# Patient Record
Sex: Male | Born: 1950 | Marital: Married | Smoking: Former smoker
Health system: Southern US, Community
[De-identification: ages and names within clinical notes are randomized; demographics above are authoritative.]

## PROBLEM LIST (undated history)

## (undated) DIAGNOSIS — C801 Malignant (primary) neoplasm, unspecified: Secondary | ICD-10-CM

## (undated) HISTORY — PX: OTHER SURGICAL HISTORY: SHX169

## (undated) HISTORY — PX: CERVICAL DISC SURGERY: SHX588

## (undated) HISTORY — PX: ROTATOR CUFF REPAIR: SHX139

---

## 2018-06-12 DIAGNOSIS — M1612 Unilateral primary osteoarthritis, left hip: Secondary | ICD-10-CM | POA: Diagnosis not present

## 2018-06-12 DIAGNOSIS — M25552 Pain in left hip: Secondary | ICD-10-CM | POA: Diagnosis not present

## 2018-06-27 ENCOUNTER — Other Ambulatory Visit: Payer: Self-pay | Admitting: Sports Medicine

## 2018-06-27 DIAGNOSIS — M1612 Unilateral primary osteoarthritis, left hip: Secondary | ICD-10-CM | POA: Diagnosis not present

## 2018-06-27 DIAGNOSIS — M7918 Myalgia, other site: Secondary | ICD-10-CM | POA: Diagnosis not present

## 2018-06-27 DIAGNOSIS — M25552 Pain in left hip: Secondary | ICD-10-CM

## 2018-07-02 ENCOUNTER — Ambulatory Visit (HOSPITAL_COMMUNITY)
Admission: RE | Admit: 2018-07-02 | Discharge: 2018-07-02 | Disposition: A | Discharge status: Home / Self Care | Payer: PPO | Source: Ambulatory Visit | Attending: Sports Medicine | Admitting: Sports Medicine

## 2018-07-02 ENCOUNTER — Other Ambulatory Visit: Payer: Self-pay

## 2018-07-02 DIAGNOSIS — M160 Bilateral primary osteoarthritis of hip: Secondary | ICD-10-CM | POA: Diagnosis not present

## 2018-07-02 DIAGNOSIS — M25552 Pain in left hip: Secondary | ICD-10-CM | POA: Diagnosis not present

## 2018-07-02 DIAGNOSIS — M7918 Myalgia, other site: Secondary | ICD-10-CM | POA: Insufficient documentation

## 2018-07-02 DIAGNOSIS — M1612 Unilateral primary osteoarthritis, left hip: Secondary | ICD-10-CM | POA: Insufficient documentation

## 2018-07-13 DIAGNOSIS — M1612 Unilateral primary osteoarthritis, left hip: Secondary | ICD-10-CM | POA: Diagnosis not present

## 2018-07-23 ENCOUNTER — Other Ambulatory Visit: Payer: Self-pay

## 2018-07-23 ENCOUNTER — Encounter
Admission: RE | Admit: 2018-07-23 | Discharge: 2018-07-23 | Disposition: A | Discharge status: Home / Self Care | Payer: PPO | Source: Ambulatory Visit | Attending: Orthopedic Surgery | Admitting: Orthopedic Surgery

## 2018-07-23 DIAGNOSIS — Z01818 Encounter for other preprocedural examination: Secondary | ICD-10-CM | POA: Insufficient documentation

## 2018-07-23 DIAGNOSIS — Z01810 Encounter for preprocedural cardiovascular examination: Secondary | ICD-10-CM | POA: Diagnosis not present

## 2018-07-23 DIAGNOSIS — Z1159 Encounter for screening for other viral diseases: Secondary | ICD-10-CM | POA: Insufficient documentation

## 2018-07-23 DIAGNOSIS — Z471 Aftercare following joint replacement surgery: Secondary | ICD-10-CM | POA: Diagnosis not present

## 2018-07-23 DIAGNOSIS — Z87891 Personal history of nicotine dependence: Secondary | ICD-10-CM | POA: Diagnosis not present

## 2018-07-23 DIAGNOSIS — Z96642 Presence of left artificial hip joint: Secondary | ICD-10-CM | POA: Diagnosis not present

## 2018-07-23 DIAGNOSIS — Z880 Allergy status to penicillin: Secondary | ICD-10-CM | POA: Diagnosis not present

## 2018-07-23 DIAGNOSIS — Z981 Arthrodesis status: Secondary | ICD-10-CM | POA: Diagnosis not present

## 2018-07-23 DIAGNOSIS — Z7951 Long term (current) use of inhaled steroids: Secondary | ICD-10-CM | POA: Diagnosis not present

## 2018-07-23 DIAGNOSIS — Z20828 Contact with and (suspected) exposure to other viral communicable diseases: Secondary | ICD-10-CM | POA: Diagnosis present

## 2018-07-23 DIAGNOSIS — M1612 Unilateral primary osteoarthritis, left hip: Secondary | ICD-10-CM | POA: Diagnosis present

## 2018-07-23 DIAGNOSIS — Z79899 Other long term (current) drug therapy: Secondary | ICD-10-CM | POA: Diagnosis not present

## 2018-07-23 DIAGNOSIS — Z8551 Personal history of malignant neoplasm of bladder: Secondary | ICD-10-CM | POA: Diagnosis not present

## 2018-07-23 HISTORY — DX: Malignant (primary) neoplasm, unspecified: C80.1

## 2018-07-23 LAB — URINALYSIS, ROUTINE W REFLEX MICROSCOPIC
Bilirubin Urine: NEGATIVE
Glucose, UA: NEGATIVE mg/dL
Hgb urine dipstick: NEGATIVE
Ketones, ur: NEGATIVE mg/dL
Leukocytes,Ua: NEGATIVE
Nitrite: NEGATIVE
Protein, ur: NEGATIVE mg/dL
Specific Gravity, Urine: 1.014 (ref 1.005–1.030)
pH: 6 (ref 5.0–8.0)

## 2018-07-23 LAB — TYPE AND SCREEN
ABO/RH(D): O POS
Antibody Screen: NEGATIVE

## 2018-07-23 LAB — CBC
HCT: 41.7 % (ref 39.0–52.0)
Hemoglobin: 14 g/dL (ref 13.0–17.0)
MCH: 31.6 pg (ref 26.0–34.0)
MCHC: 33.6 g/dL (ref 30.0–36.0)
MCV: 94.1 fL (ref 80.0–100.0)
Platelets: 210 10*3/uL (ref 150–400)
RBC: 4.43 MIL/uL (ref 4.22–5.81)
RDW: 13.3 % (ref 11.5–15.5)
WBC: 8.5 10*3/uL (ref 4.0–10.5)
nRBC: 0 % (ref 0.0–0.2)

## 2018-07-23 LAB — BASIC METABOLIC PANEL
Anion gap: 10 (ref 5–15)
BUN: 20 mg/dL (ref 8–23)
CO2: 24 mmol/L (ref 22–32)
Calcium: 9.1 mg/dL (ref 8.9–10.3)
Chloride: 100 mmol/L (ref 98–111)
Creatinine, Ser: 0.79 mg/dL (ref 0.61–1.24)
GFR calc Af Amer: >60 mL/min (ref >60)
GFR calc non Af Amer: >60 mL/min (ref >60)
Glucose, Bld: 90 mg/dL (ref 70–99)
Potassium: 4 mmol/L (ref 3.5–5.1)
Sodium: 134 mmol/L — ABNORMAL LOW (ref 135–145)

## 2018-07-23 LAB — SURGICAL PCR SCREEN
MRSA, PCR: NEGATIVE
Staphylococcus aureus: POSITIVE — AB

## 2018-07-23 LAB — PROTIME-INR
INR: 0.9 (ref 0.8–1.2)
Prothrombin Time: 12.4 seconds (ref 11.4–15.2)

## 2018-07-23 LAB — APTT: aPTT: 35 seconds (ref 24–36)

## 2018-07-23 LAB — SEDIMENTATION RATE: Sed Rate: 8 mm/hr (ref 0–20)

## 2018-07-23 NOTE — Patient Instructions (Signed)
Your procedure is scheduled on: Thursday 07/26/18 Report to [place]. To find out your arrival time please call [phone] between 1PM - 3PM on Wednesday 07/25/18.  Remember: Instructions that are not followed completely may result in serious medical risk, up to and including death, or upon the discretion of your surgeon and anesthesiologist your surgery may need to be rescheduled.     _X__ 1. Do not eat food after midnight the night before your procedure.                 No gum chewing or hard candies. You may drink clear liquids up to 2 hours                 before you are scheduled to arrive for your surgery- DO not drink clear                 liquids within 2 hours of the start of your surgery.                 Clear Liquids include:  water, apple juice without pulp, clear carbohydrate                 drink such as Clearfast or Gatorade, Black Coffee or Tea (Do not add                 anything to coffee or tea).  __X__2.  On the morning of surgery brush your teeth with toothpaste and water, you                 may rinse your mouth with mouthwash if you wish.  Do not swallow any              toothpaste of mouthwash.     _X__ 3.  No Alcohol for 24 hours before or after surgery.   _X__ 4.  Do Not Smoke or use e-cigarettes For 24 Hours Prior to Your Surgery.                 Do not use any chewable tobacco products for at least 6 hours prior to                 surgery.  ____  5.  Bring all medications with you on the day of surgery if instructed.   __X__  6.  Notify your doctor if there is any change in your medical condition      (cold, fever, infections).     Do not wear jewelry, make-up, hairpins, clips or nail polish. Do not wear lotions, powders, or perfumes.  Do not shave 48 hours prior to surgery. Men may shave face and neck. Do not bring valuables to the hospital.    [facility] is not responsible for any belongings or  valuables.  Contacts, dentures/partials or body piercings may not be worn into surgery. Bring a case for your contacts, glasses or hearing aids, a denture cup will be supplied. Leave your suitcase in the car. After surgery it may be brought to your room. For patients admitted to the hospital, discharge time is determined by your treatment team.   Patients discharged the day of surgery will not be allowed to drive home.   Please read over the following fact sheets that you were given:   MRSA Information  __X__ Take these medicines the morning of surgery with A SIP OF WATER:  1. loratadine (CLARITIN)  2.   3.   4.  5.  6.  ____ Fleet Enema (as directed)   __X__ Use CHG Soap/SAGE wipes as directed  ____ Use inhalers on the day of surgery  ____ Stop metformin/Janumet/Farxiga 2 days prior to surgery    ____ Take 1/2 of usual insulin dose the night before surgery. No insulin the morning          of surgery.   ____ Stop Blood Thinners Coumadin/Plavix/Xarelto/Pleta/Pradaxa/Eliquis/Effient/Aspirin  on   Or contact your Surgeon, Cardiologist or Medical Doctor regarding  ability to stop your blood thinners  __X__ Stop Anti-inflammatories 7 days before surgery such as Advil, Ibuprofen, Motrin,  BC or Goodies Powder, Naprosyn, Naproxen, Aleve, Aspirin    __X__ Stop all herbal supplements, fish oil or vitamin E until after surgery.    ____ Bring C-Pap to the hospital.

## 2018-07-24 LAB — NOVEL CORONAVIRUS, NAA (HOSP ORDER, SEND-OUT TO REF LAB; TAT 18-24 HRS): SARS-CoV-2, NAA: NOT DETECTED

## 2018-07-24 LAB — URINE CULTURE: Culture: NO GROWTH

## 2018-07-25 MED ORDER — CLINDAMYCIN PHOSPHATE 900 MG/50ML IV SOLN
900.0000 mg | Freq: Once | INTRAVENOUS | Status: AC
  Administered 2018-07-26: 900 mg via INTRAVENOUS

## 2018-07-26 ENCOUNTER — Inpatient Hospital Stay: Payer: PPO

## 2018-07-26 ENCOUNTER — Inpatient Hospital Stay: Payer: PPO | Admitting: Anesthesiology

## 2018-07-26 ENCOUNTER — Other Ambulatory Visit: Payer: Self-pay

## 2018-07-26 ENCOUNTER — Encounter
Admission: RE | Disposition: A | Discharge status: Home / Self Care | Payer: Self-pay | Source: Home / Self Care | Attending: Orthopedic Surgery

## 2018-07-26 ENCOUNTER — Inpatient Hospital Stay
Admission: RE | Admit: 2018-07-26 | Discharge: 2018-07-28 | DRG: 470 | Disposition: A | Discharge status: Home Health | Payer: PPO | Attending: Orthopedic Surgery | Admitting: Orthopedic Surgery

## 2018-07-26 DIAGNOSIS — Z7951 Long term (current) use of inhaled steroids: Secondary | ICD-10-CM | POA: Diagnosis not present

## 2018-07-26 DIAGNOSIS — Z8551 Personal history of malignant neoplasm of bladder: Secondary | ICD-10-CM | POA: Diagnosis not present

## 2018-07-26 DIAGNOSIS — Z880 Allergy status to penicillin: Secondary | ICD-10-CM

## 2018-07-26 DIAGNOSIS — Z79899 Other long term (current) drug therapy: Secondary | ICD-10-CM | POA: Diagnosis not present

## 2018-07-26 DIAGNOSIS — Z96642 Presence of left artificial hip joint: Secondary | ICD-10-CM

## 2018-07-26 DIAGNOSIS — M1612 Unilateral primary osteoarthritis, left hip: Secondary | ICD-10-CM | POA: Diagnosis present

## 2018-07-26 DIAGNOSIS — Z87891 Personal history of nicotine dependence: Secondary | ICD-10-CM

## 2018-07-26 DIAGNOSIS — Z20828 Contact with and (suspected) exposure to other viral communicable diseases: Secondary | ICD-10-CM | POA: Diagnosis present

## 2018-07-26 DIAGNOSIS — G8918 Other acute postprocedural pain: Secondary | ICD-10-CM

## 2018-07-26 DIAGNOSIS — Z419 Encounter for procedure for purposes other than remedying health state, unspecified: Secondary | ICD-10-CM

## 2018-07-26 DIAGNOSIS — Z981 Arthrodesis status: Secondary | ICD-10-CM

## 2018-07-26 DIAGNOSIS — Z471 Aftercare following joint replacement surgery: Secondary | ICD-10-CM | POA: Diagnosis not present

## 2018-07-26 HISTORY — PX: TOTAL HIP ARTHROPLASTY: SHX124

## 2018-07-26 LAB — CBC
HCT: 41.3 % (ref 39.0–52.0)
Hemoglobin: 13.8 g/dL (ref 13.0–17.0)
MCH: 31.7 pg (ref 26.0–34.0)
MCHC: 33.4 g/dL (ref 30.0–36.0)
MCV: 94.9 fL (ref 80.0–100.0)
Platelets: 186 10*3/uL (ref 150–400)
RBC: 4.35 MIL/uL (ref 4.22–5.81)
RDW: 13.7 % (ref 11.5–15.5)
WBC: 8.2 10*3/uL (ref 4.0–10.5)
nRBC: 0 % (ref 0.0–0.2)

## 2018-07-26 LAB — CREATININE, SERUM
Creatinine, Ser: 0.69 mg/dL (ref 0.61–1.24)
GFR calc Af Amer: >60 mL/min (ref >60)
GFR calc non Af Amer: >60 mL/min (ref >60)

## 2018-07-26 LAB — ABO/RH: ABO/RH(D): O POS

## 2018-07-26 SURGERY — ARTHROPLASTY, HIP, TOTAL,POSTERIOR APPROACH
Anesthesia: Spinal | Site: Hip | Laterality: Left

## 2018-07-26 MED ORDER — GABAPENTIN 300 MG PO CAPS
300.0000 mg | ORAL_CAPSULE | Freq: Three times a day (TID) | ORAL | Status: DC
  Administered 2018-07-26 – 2018-07-28 (×6): 300 mg via ORAL
  Filled 2018-07-26 (×6): qty 1

## 2018-07-26 MED ORDER — FLUTICASONE PROPIONATE 50 MCG/ACT NA SUSP
2.0000 | Freq: Every day | NASAL | Status: DC
  Administered 2018-07-26 – 2018-07-28 (×3): 2 via NASAL
  Filled 2018-07-26: qty 16

## 2018-07-26 MED ORDER — LORATADINE 10 MG PO TABS
10.0000 mg | ORAL_TABLET | Freq: Every day | ORAL | Status: DC
  Administered 2018-07-27 – 2018-07-28 (×2): 10 mg via ORAL
  Filled 2018-07-26 (×3): qty 1

## 2018-07-26 MED ORDER — METOCLOPRAMIDE HCL 10 MG PO TABS
5.0000 mg | ORAL_TABLET | Freq: Three times a day (TID) | ORAL | Status: DC | PRN
  Administered 2018-07-27: 09:00:00 10 mg via ORAL
  Filled 2018-07-26: qty 1

## 2018-07-26 MED ORDER — PHENYLEPHRINE HCL (PRESSORS) 10 MG/ML IV SOLN
INTRAVENOUS | Status: AC
  Filled 2018-07-26: qty 1

## 2018-07-26 MED ORDER — FAMOTIDINE 20 MG PO TABS
20.0000 mg | ORAL_TABLET | Freq: Once | ORAL | Status: AC
  Administered 2018-07-26: 20 mg via ORAL

## 2018-07-26 MED ORDER — CLINDAMYCIN PHOSPHATE 900 MG/50ML IV SOLN
INTRAVENOUS | Status: AC
  Filled 2018-07-26: qty 50

## 2018-07-26 MED ORDER — BUPIVACAINE-EPINEPHRINE 0.25% -1:200000 IJ SOLN
INTRAMUSCULAR | Status: DC | PRN
  Administered 2018-07-26: 30 mL

## 2018-07-26 MED ORDER — METHOCARBAMOL 500 MG PO TABS: 500.0000 mg | ORAL_TABLET | Freq: Four times a day (QID) | ORAL | Status: DC | PRN

## 2018-07-26 MED ORDER — NEOMYCIN-POLYMYXIN B GU 40-200000 IR SOLN
Status: DC | PRN
  Administered 2018-07-26: 4 mL

## 2018-07-26 MED ORDER — NEOMYCIN-POLYMYXIN B GU 40-200000 IR SOLN
Status: AC
  Filled 2018-07-26: qty 4

## 2018-07-26 MED ORDER — EPHEDRINE SULFATE 50 MG/ML IJ SOLN
INTRAMUSCULAR | Status: DC | PRN
  Administered 2018-07-26: 10 mg via INTRAVENOUS

## 2018-07-26 MED ORDER — BUPIVACAINE-EPINEPHRINE (PF) 0.25% -1:200000 IJ SOLN
INTRAMUSCULAR | Status: AC
  Filled 2018-07-26: qty 30

## 2018-07-26 MED ORDER — LACTATED RINGERS IV SOLN
INTRAVENOUS | Status: DC
  Administered 2018-07-26 (×2): via INTRAVENOUS

## 2018-07-26 MED ORDER — PROPOFOL 10 MG/ML IV BOLUS
INTRAVENOUS | Status: DC | PRN
  Administered 2018-07-26: 20 mg via INTRAVENOUS

## 2018-07-26 MED ORDER — PROPOFOL 10 MG/ML IV BOLUS
INTRAVENOUS | Status: AC
  Filled 2018-07-26: qty 20

## 2018-07-26 MED ORDER — BUPIVACAINE HCL (PF) 0.5 % IJ SOLN
INTRAMUSCULAR | Status: DC | PRN
  Administered 2018-07-26: 3 mL via INTRATHECAL

## 2018-07-26 MED ORDER — DOCUSATE SODIUM 100 MG PO CAPS
100.0000 mg | ORAL_CAPSULE | Freq: Two times a day (BID) | ORAL | Status: DC
  Administered 2018-07-26 – 2018-07-28 (×4): 100 mg via ORAL
  Filled 2018-07-26 (×6): qty 1

## 2018-07-26 MED ORDER — LIDOCAINE HCL (PF) 2 % IJ SOLN
INTRAMUSCULAR | Status: DC | PRN
  Administered 2018-07-26: 50 mg

## 2018-07-26 MED ORDER — LIDOCAINE HCL (PF) 2 % IJ SOLN
INTRAMUSCULAR | Status: AC
  Filled 2018-07-26: qty 10

## 2018-07-26 MED ORDER — BUPIVACAINE LIPOSOME 1.3 % IJ SUSP
INTRAMUSCULAR | Status: AC
  Filled 2018-07-26: qty 20

## 2018-07-26 MED ORDER — MIDAZOLAM HCL 2 MG/2ML IJ SOLN
INTRAMUSCULAR | Status: AC
  Filled 2018-07-26: qty 2

## 2018-07-26 MED ORDER — MORPHINE SULFATE (PF) 2 MG/ML IV SOLN
0.5000 mg | INTRAVENOUS | Status: DC | PRN
  Administered 2018-07-26: 1 mg via INTRAVENOUS
  Filled 2018-07-26: qty 1

## 2018-07-26 MED ORDER — ENOXAPARIN SODIUM 30 MG/0.3ML ~~LOC~~ SOLN
30.0000 mg | Freq: Two times a day (BID) | SUBCUTANEOUS | Status: DC
  Administered 2018-07-27 – 2018-07-28 (×3): 30 mg via SUBCUTANEOUS
  Filled 2018-07-26 (×3): qty 0.3

## 2018-07-26 MED ORDER — SODIUM CHLORIDE 0.9 % IV SOLN
INTRAVENOUS | Status: DC
  Administered 2018-07-26: 11:00:00 via INTRAVENOUS

## 2018-07-26 MED ORDER — SUMATRIPTAN SUCCINATE 50 MG PO TABS
100.0000 mg | ORAL_TABLET | Freq: Once | ORAL | Status: DC | PRN
  Filled 2018-07-26: qty 2

## 2018-07-26 MED ORDER — FENTANYL CITRATE (PF) 100 MCG/2ML IJ SOLN
INTRAMUSCULAR | Status: DC | PRN
  Administered 2018-07-26 (×2): 50 ug via INTRAVENOUS

## 2018-07-26 MED ORDER — MENTHOL 3 MG MT LOZG: 1.0000 | LOZENGE | OROMUCOSAL | Status: DC | PRN

## 2018-07-26 MED ORDER — SODIUM CHLORIDE 0.9 % IV SOLN
INTRAVENOUS | Status: DC | PRN
  Administered 2018-07-26: 80 mL

## 2018-07-26 MED ORDER — OXYCODONE HCL 5 MG PO TABS: 5.0000 mg | ORAL_TABLET | Freq: Once | ORAL | Status: DC | PRN

## 2018-07-26 MED ORDER — MIDAZOLAM HCL 5 MG/5ML IJ SOLN
INTRAMUSCULAR | Status: DC | PRN
  Administered 2018-07-26 (×2): 2 mg via INTRAVENOUS

## 2018-07-26 MED ORDER — KETAMINE HCL 50 MG/ML IJ SOLN
INTRAMUSCULAR | Status: DC | PRN
  Administered 2018-07-26: 25 mg via INTRAVENOUS
  Administered 2018-07-26: 25 mg via INTRAMUSCULAR

## 2018-07-26 MED ORDER — ZOLPIDEM TARTRATE 5 MG PO TABS: 5.0000 mg | ORAL_TABLET | Freq: Every evening | ORAL | Status: DC | PRN

## 2018-07-26 MED ORDER — TRANEXAMIC ACID 1000 MG/10ML IV SOLN
INTRAVENOUS | Status: AC
  Filled 2018-07-26: qty 10

## 2018-07-26 MED ORDER — ALUM & MAG HYDROXIDE-SIMETH 200-200-20 MG/5ML PO SUSP: 30.0000 mL | ORAL | Status: DC | PRN

## 2018-07-26 MED ORDER — TRANEXAMIC ACID-NACL 1000-0.7 MG/100ML-% IV SOLN
INTRAVENOUS | Status: DC | PRN
  Administered 2018-07-26: 1000 mg via INTRAVENOUS

## 2018-07-26 MED ORDER — BUPIVACAINE HCL (PF) 0.5 % IJ SOLN
INTRAMUSCULAR | Status: AC
  Filled 2018-07-26: qty 10

## 2018-07-26 MED ORDER — FENTANYL CITRATE (PF) 100 MCG/2ML IJ SOLN: 25.0000 ug | INTRAMUSCULAR | Status: DC | PRN

## 2018-07-26 MED ORDER — MAGNESIUM HYDROXIDE 400 MG/5ML PO SUSP
30.0000 mL | Freq: Every day | ORAL | Status: DC | PRN
  Administered 2018-07-27: 09:00:00 30 mL via ORAL
  Filled 2018-07-26: qty 30

## 2018-07-26 MED ORDER — PHENYLEPHRINE HCL (PRESSORS) 10 MG/ML IV SOLN
INTRAVENOUS | Status: DC | PRN
  Administered 2018-07-26: 100 ug via INTRAVENOUS

## 2018-07-26 MED ORDER — METHOCARBAMOL 1000 MG/10ML IJ SOLN
500.0000 mg | Freq: Four times a day (QID) | INTRAVENOUS | Status: DC | PRN
  Filled 2018-07-26: qty 5

## 2018-07-26 MED ORDER — PROPOFOL 500 MG/50ML IV EMUL
INTRAVENOUS | Status: DC | PRN
  Administered 2018-07-26: 25 ug/kg/min via INTRAVENOUS

## 2018-07-26 MED ORDER — METOCLOPRAMIDE HCL 5 MG/ML IJ SOLN: 5.0000 mg | Freq: Three times a day (TID) | INTRAMUSCULAR | Status: DC | PRN

## 2018-07-26 MED ORDER — ONDANSETRON HCL 4 MG PO TABS: 4.0000 mg | ORAL_TABLET | Freq: Four times a day (QID) | ORAL | Status: DC | PRN

## 2018-07-26 MED ORDER — PHENOL 1.4 % MT LIQD: 1.0000 | OROMUCOSAL | Status: DC | PRN

## 2018-07-26 MED ORDER — KETAMINE HCL 50 MG/ML IJ SOLN
INTRAMUSCULAR | Status: AC
  Filled 2018-07-26: qty 10

## 2018-07-26 MED ORDER — ACETAMINOPHEN 500 MG PO TABS
500.0000 mg | ORAL_TABLET | Freq: Four times a day (QID) | ORAL | Status: AC
  Administered 2018-07-26 – 2018-07-27 (×2): 500 mg via ORAL
  Filled 2018-07-26 (×3): qty 1

## 2018-07-26 MED ORDER — FAMOTIDINE 20 MG PO TABS
ORAL_TABLET | ORAL | Status: AC
  Administered 2018-07-26: 07:00:00 20 mg via ORAL
  Filled 2018-07-26: qty 1

## 2018-07-26 MED ORDER — ONDANSETRON HCL 4 MG/2ML IJ SOLN: 4.0000 mg | Freq: Four times a day (QID) | INTRAMUSCULAR | Status: DC | PRN

## 2018-07-26 MED ORDER — GLYCOPYRROLATE 0.2 MG/ML IJ SOLN
INTRAMUSCULAR | Status: AC
  Filled 2018-07-26: qty 1

## 2018-07-26 MED ORDER — HYDROCODONE-ACETAMINOPHEN 7.5-325 MG PO TABS
1.0000 | ORAL_TABLET | ORAL | Status: DC | PRN
  Administered 2018-07-27: 14:00:00 1 via ORAL
  Filled 2018-07-26: qty 1

## 2018-07-26 MED ORDER — PROMETHAZINE HCL 25 MG PO TABS
25.0000 mg | ORAL_TABLET | Freq: Four times a day (QID) | ORAL | Status: DC | PRN
  Filled 2018-07-26: qty 1

## 2018-07-26 MED ORDER — HYDROCODONE-ACETAMINOPHEN 5-325 MG PO TABS
1.0000 | ORAL_TABLET | ORAL | Status: DC | PRN
  Administered 2018-07-26: 15:00:00 2 via ORAL
  Administered 2018-07-26: 11:00:00 1 via ORAL
  Administered 2018-07-26: 21:00:00 2 via ORAL
  Administered 2018-07-27 (×3): 1 via ORAL
  Administered 2018-07-28: 2 via ORAL
  Filled 2018-07-26: qty 1
  Filled 2018-07-26 (×3): qty 2
  Filled 2018-07-26: qty 1
  Filled 2018-07-26: qty 2
  Filled 2018-07-26: qty 1

## 2018-07-26 MED ORDER — GLYCOPYRROLATE 0.2 MG/ML IJ SOLN
INTRAMUSCULAR | Status: DC | PRN
  Administered 2018-07-26: 0.2 mg via INTRAVENOUS

## 2018-07-26 MED ORDER — SODIUM CHLORIDE FLUSH 0.9 % IV SOLN
INTRAVENOUS | Status: AC
  Filled 2018-07-26: qty 60

## 2018-07-26 MED ORDER — FENTANYL CITRATE (PF) 100 MCG/2ML IJ SOLN
INTRAMUSCULAR | Status: AC
  Filled 2018-07-26: qty 2

## 2018-07-26 MED ORDER — CLINDAMYCIN PHOSPHATE 900 MG/50ML IV SOLN
900.0000 mg | Freq: Four times a day (QID) | INTRAVENOUS | Status: AC
  Administered 2018-07-26 – 2018-07-27 (×3): 900 mg via INTRAVENOUS
  Filled 2018-07-26 (×3): qty 50

## 2018-07-26 MED ORDER — MAGNESIUM CITRATE PO SOLN: 1.0000 | Freq: Once | ORAL | Status: DC | PRN

## 2018-07-26 MED ORDER — ACETAMINOPHEN 325 MG PO TABS: 325.0000 mg | ORAL_TABLET | Freq: Four times a day (QID) | ORAL | Status: DC | PRN

## 2018-07-26 MED ORDER — TRAMADOL HCL 50 MG PO TABS
50.0000 mg | ORAL_TABLET | Freq: Four times a day (QID) | ORAL | Status: DC
  Administered 2018-07-26 – 2018-07-28 (×5): 50 mg via ORAL
  Filled 2018-07-26 (×5): qty 1

## 2018-07-26 MED ORDER — TRIAMCINOLONE ACETONIDE 0.5 % EX CREA
TOPICAL_CREAM | Freq: Two times a day (BID) | CUTANEOUS | Status: DC
  Administered 2018-07-26: 21:00:00 via TOPICAL
  Administered 2018-07-27: 1 via TOPICAL
  Administered 2018-07-28: 08:00:00 via TOPICAL
  Filled 2018-07-26: qty 15

## 2018-07-26 MED ORDER — BISACODYL 5 MG PO TBEC: 5.0000 mg | DELAYED_RELEASE_TABLET | Freq: Every day | ORAL | Status: DC | PRN

## 2018-07-26 SURGICAL SUPPLY — 61 items
BLADE SAGITTAL AGGR TOOTH XLG (BLADE) ×3 IMPLANT
BNDG COHESIVE 6X5 TAN STRL LF (GAUZE/BANDAGES/DRESSINGS) ×9 IMPLANT
CANISTER SUCT 1200ML W/VALVE (MISCELLANEOUS) ×3 IMPLANT
CANISTER WOUND CARE 500ML ATS (WOUND CARE) ×3 IMPLANT
CHLORAPREP W/TINT 26 (MISCELLANEOUS) ×3 IMPLANT
COVER WAND RF STERILE (DRAPES) ×3 IMPLANT
DRAPE C-ARM XRAY 36X54 (DRAPES) ×3 IMPLANT
DRAPE INCISE IOBAN 66X60 STRL (DRAPES) IMPLANT
DRAPE POUCH INSTRU U-SHP 10X18 (DRAPES) ×3 IMPLANT
DRAPE SHEET LG 3/4 BI-LAMINATE (DRAPES) ×9 IMPLANT
DRAPE TABLE BACK 80X90 (DRAPES) ×3 IMPLANT
DRESSING SURGICEL FIBRLLR 1X2 (HEMOSTASIS) ×2 IMPLANT
DRSG OPSITE POSTOP 4X8 (GAUZE/BANDAGES/DRESSINGS) ×6 IMPLANT
DRSG SURGICEL FIBRILLAR 1X2 (HEMOSTASIS) ×6
ELECT BLADE 6.5 EXT (BLADE) ×3 IMPLANT
ELECT REM PT RETURN 9FT ADLT (ELECTROSURGICAL) ×3
ELECTRODE REM PT RTRN 9FT ADLT (ELECTROSURGICAL) ×1 IMPLANT
GLOVE BIOGEL PI IND STRL 9 (GLOVE) ×1 IMPLANT
GLOVE BIOGEL PI INDICATOR 9 (GLOVE) ×2
GLOVE SURG SYN 9.0  PF PI (GLOVE) ×4
GLOVE SURG SYN 9.0 PF PI (GLOVE) ×2 IMPLANT
GOWN SRG 2XL LVL 4 RGLN SLV (GOWNS) ×1 IMPLANT
GOWN STRL NON-REIN 2XL LVL4 (GOWNS) ×2
GOWN STRL REUS W/ TWL LRG LVL3 (GOWN DISPOSABLE) ×1 IMPLANT
GOWN STRL REUS W/TWL LRG LVL3 (GOWN DISPOSABLE) ×2
HEAD FEMORAL SZ28 LGE BIOLOX (Head) ×2 IMPLANT
HEMOVAC 400CC 10FR (MISCELLANEOUS) IMPLANT
HOLDER FOLEY CATH W/STRAP (MISCELLANEOUS) ×3 IMPLANT
HOOD PEEL AWAY FLYTE STAYCOOL (MISCELLANEOUS) ×3 IMPLANT
KIT PREVENA INCISION MGT 13 (CANNISTER) ×3 IMPLANT
LINER DM 28MM (Liner) ×3 IMPLANT
LINER DM SZH 28X56 (Liner) IMPLANT
MAT ABSORB  FLUID 56X50 GRAY (MISCELLANEOUS) ×2
MAT ABSORB FLUID 56X50 GRAY (MISCELLANEOUS) ×1 IMPLANT
NDL SAFETY ECLIPSE 18X1.5 (NEEDLE) ×1 IMPLANT
NDL SPNL 20GX3.5 QUINCKE YW (NEEDLE) ×2 IMPLANT
NEEDLE HYPO 18GX1.5 SHARP (NEEDLE) ×2
NEEDLE SPNL 20GX3.5 QUINCKE YW (NEEDLE) ×6 IMPLANT
NS IRRIG 1000ML POUR BTL (IV SOLUTION) ×3 IMPLANT
PACK HIP COMPR (MISCELLANEOUS) ×3 IMPLANT
SCALPEL PROTECTED #10 DISP (BLADE) ×6 IMPLANT
SHELL ACETABULAR DM  56MM (Shell) ×2 IMPLANT
SOL PREP PVP 2OZ (MISCELLANEOUS) ×3
SOLUTION PREP PVP 2OZ (MISCELLANEOUS) ×1 IMPLANT
SPONGE DRAIN TRACH 4X4 STRL 2S (GAUZE/BANDAGES/DRESSINGS) ×3 IMPLANT
STAPLER SKIN PROX 35W (STAPLE) ×3 IMPLANT
STEM FEMORAL SZ5 STD COLLARED (Stem) ×2 IMPLANT
STRAP SAFETY 5IN WIDE (MISCELLANEOUS) ×3 IMPLANT
SUT DVC 2 QUILL PDO  T11 36X36 (SUTURE) ×2
SUT DVC 2 QUILL PDO T11 36X36 (SUTURE) ×1 IMPLANT
SUT SILK 0 (SUTURE) ×2
SUT SILK 0 30XBRD TIE 6 (SUTURE) ×1 IMPLANT
SUT V-LOC 90 ABS DVC 3-0 CL (SUTURE) ×3 IMPLANT
SUT VIC AB 1 CT1 36 (SUTURE) ×3 IMPLANT
SYR 20CC LL (SYRINGE) ×3 IMPLANT
SYR 30ML LL (SYRINGE) ×3 IMPLANT
SYR 50ML LL SCALE MARK (SYRINGE) ×6 IMPLANT
SYR BULB IRRIG 60ML STRL (SYRINGE) ×3 IMPLANT
TAPE MICROFOAM 4IN (TAPE) ×3 IMPLANT
TOWEL OR 17X26 4PK STRL BLUE (TOWEL DISPOSABLE) ×3 IMPLANT
TRAY FOLEY MTR SLVR 16FR STAT (SET/KITS/TRAYS/PACK) ×3 IMPLANT

## 2018-07-26 NOTE — TOC Benefit Eligibility Note (Signed)
Transition of Care [facility]) Benefit Eligibility Note    Patient Details  Name: Raymond Duran MRN: [id number] Date of Birth: August 17, 1950   Medication/Dose: Lovenox 40mg  once daily for 14 days  Covered?: No   Prescription Coverage Preferred Pharmacy: any retail pharmacy  Spoke with Person/Company/Phone Number:: Helene Kelp with [employer] at [phone]  Co-Pay: $90 estimated cost for name brand if approved.  Prior Approval: Yes(PA required for name brand: [phone])  Deductible: (No deductible on plan per rep.)  Additional Notes: Generic Enoxaparin covered with no PA required.  Considered Tier 4.  Estimated copay $90.    Dannette Barbara Phone Number: [phone] or [phone] 07/26/2018, 2:26 PM

## 2018-07-26 NOTE — Transfer of Care (Signed)
Immediate Anesthesia Transfer of Care Note  Patient: Raymond Duran  Procedure(s) Performed: TOTAL HIP ARTHROPLASTY LEFT (Left Hip)  Patient Location: PACU  Anesthesia Type:Spinal  Level of Consciousness: awake  Airway & Oxygen Therapy: Patient Spontanous Breathing and Patient connected to face mask oxygen  Post-op Assessment: Report given to RN and Post -op Vital signs reviewed and stable  Post vital signs: Reviewed  Last Vitals:  Vitals Value Taken Time  BP 101/68 07/26/18 0907  Temp 36.1 C 07/26/18 0907  Pulse 64 07/26/18 0907  Resp 19 07/26/18 0907  SpO2 97 % 07/26/18 0907  Vitals shown include unvalidated device data.  Last Pain:  Vitals:   07/26/18 0635  TempSrc: Temporal  PainSc: 0-No pain         Complications: No apparent anesthesia complications

## 2018-07-26 NOTE — Progress Notes (Signed)
Physical Therapy Evaluation Patient Details Name: Raymond Duran MRN: [id number] DOB: 1950-12-03 Today's Date: 07/26/2018   History of Present Illness  Raymond Duran is a 68 y.o. male S/P left THR, anterior approach and WBAT LLE.   Clinical Impression  Patient is motivated to try and get out of  Bed. He has 2/5 strength LLE hip and decreased sensation to light touch LLE. He is MI for bed mobility and transfers sit to stand with RW. He is able to stand for several minutes and then transfer to recliner with min assist, WBAT LLE. He is able to perform AROM exercises for LLE and AAROM LLE. He will continue to benefit from skilled PT to improve mobility and strength and return to prior level of function.     Follow Up Recommendations Home health PT    Equipment Recommendations  (RW)    Recommendations for Other Services OT consult     Precautions / Restrictions Precautions Precautions: Anterior Hip Restrictions Weight Bearing Restrictions: Yes LLE Weight Bearing: (WBAT LLE)      Mobility  Bed Mobility Overal bed mobility: Modified Independent                Transfers Overall transfer level: Modified independent Equipment used: Rolling walker (2 wheeled)             General transfer comment: needs vc for sequencng  Ambulation/Gait                Stairs            Wheelchair Mobility    Modified Rankin (Stroke Patients Only)       Balance Overall balance assessment: Modified Independent                                           Pertinent Vitals/Pain Pain Assessment: 0-10 Pain Score: 5  Pain Location: (left hip) Pain Descriptors / Indicators: Aching Pain Intervention(s): Limited activity within patient's tolerance;Monitored during session    Home Living Family/patient expects to be discharged to:: Private residence Living Arrangements: Spouse/significant other Available Help at Discharge: Family Type of Home:  House Home Access: Stairs to enter Entrance Stairs-Rails: Right Entrance Stairs-Number of Steps: 3          Prior Function Level of Independence: Independent               Hand Dominance        Extremity/Trunk Assessment   Upper Extremity Assessment Upper Extremity Assessment: Overall WFL for tasks assessed    Lower Extremity Assessment Lower Extremity Assessment: LLE deficits/detail LLE Deficits / Details: left hip 1/5, knee 3/5, ankle 4/5 LLE Sensation: decreased light touch LLE Coordination: decreased gross motor       Communication   Communication: No difficulties  Cognition Arousal/Alertness: Awake/alert Behavior During Therapy: WFL for tasks assessed/performed Overall Cognitive Status: Within Functional Limits for tasks assessed                                        General Comments      Exercises Total Joint Exercises Ankle Circles/Pumps: 10 reps Quad Sets: 10 reps Gluteal Sets: 10 reps Short Arc Quad: 10 reps Long Arc Quad: 10 reps   Assessment/Plan    PT Assessment Patient needs continued PT services  PT Problem List Decreased strength;Decreased range of motion;Decreased activity tolerance;Decreased mobility;Impaired tone;Pain       PT Treatment Interventions Gait training;Stair training;Functional mobility training;Therapeutic activities;Therapeutic exercise    PT Goals (Current goals can be found in the Care Plan section)  Acute Rehab PT Goals Patient Stated Goal: (to walk better) PT Goal Formulation: With patient Time For Goal Achievement: 08/09/18 Potential to Achieve Goals: Good    Frequency BID   Barriers to discharge        Co-evaluation               AM-PAC PT "6 Clicks" Mobility  Outcome Measure Help needed turning from your back to your side while in a flat bed without using bedrails?: A Little Help needed moving from lying on your back to sitting on the side of a flat bed without using  bedrails?: A Little Help needed moving to and from a bed to a chair (including a wheelchair)?: A Little Help needed standing up from a chair using your arms (e.g., wheelchair or bedside chair)?: A Little Help needed to walk in hospital room?: A Lot Help needed climbing 3-5 steps with a railing? : A Lot 6 Click Score: 16    End of Session Equipment Utilized During Treatment: Gait belt Activity Tolerance: Patient tolerated treatment well;Patient limited by fatigue Patient left: in chair;with call bell/phone within reach   PT Visit Diagnosis: Muscle weakness (generalized) (M62.81);Difficulty in walking, not elsewhere classified (R26.2)    Time: 1320-1350 PT Time Calculation (min) (ACUTE ONLY): 30 min   Charges:   PT Evaluation $PT Eval Low Complexity: 1 Low $PT Eval Moderate Complexity: 1 Mod PT Treatments $Therapeutic Exercise: 8-22 mins          Alanson Puls, PT DPT 07/26/2018, 3:06 PM

## 2018-07-26 NOTE — H&P (Signed)
Reviewed paper H+P, will be scanned into chart. No changes noted.  

## 2018-07-26 NOTE — TOC Progression Note (Signed)
Transition of Care [facility]) - Progression Note    Patient Details  Name: Raymond Duran MRN: [id number] Date of Birth: [birth year]  Transition of [facility]) CM/SW Contact  Isadora Delorey, Veronia Beets, [place] Phone Number: 07/26/2018, 10:34 AM  Clinical Narrative:   Lovenox price requested. Per Helene Kelp [facility] representative they can accept patient for home health.   [employer], LCSW [phone]          Expected Discharge Plan and Services                                                 Social Determinants of Health (SDOH) Interventions    Readmission Risk Interventions No flowsheet data found.

## 2018-07-26 NOTE — Op Note (Signed)
07/26/2018  9:11 AM  PATIENT:  Raymond Duran  68 y.o. male  PRE-OPERATIVE DIAGNOSIS:  PRIMARY OSTEOARTHRITIS OF LEFT HIP  POST-OPERATIVE DIAGNOSIS:  PRIMARY OSTEOARTHRITIS OF LEFT HIP  PROCEDURE:  Procedure(s): TOTAL HIP ARTHROPLASTY LEFT (Left)  SURGEON: Laurene Footman, MD  ASSISTANTS: none  ANESTHESIA:   spinal  EBL:  Total I/O In: 1100 [I.V.:1100] Out: 175 [Urine:100; Blood:75]  BLOOD ADMINISTERED:none  DRAINS: none   LOCAL MEDICATIONS USED:  MARCAINE    and OTHER exparel  SPECIMEN:  Source of Specimen:  left femoral head  DISPOSITION OF SPECIMEN:  PATHOLOGY  COUNTS:  YES  TOURNIQUET:  * No tourniquets in log *  IMPLANTS: Medacta AMIS 5 standard stem with ceramic L 28 mm head, 56 mm Mpact DM cup and liner  DICTATION: .Dragon Dictation   The patient was brought to the operating room and after spinal anesthesia was obtained patient was placed on the operative table with the ipsilateral foot into the Medacta attachment, contralateral leg on a well-padded table. C-arm was brought in and preop template x-ray taken. After prepping and draping in usual sterile fashion appropriate patient identification and timeout procedures were completed. Anterior approach to the hip was obtained and centered over the greater trochanter and TFL muscle. The subcutaneous tissue was incised hemostasis being achieved by electrocautery. TFL fascia was incised and the muscle retracted laterally deep retractor placed. The lateral femoral circumflex vessels were identified and ligated. The anterior capsule was exposed and a capsulotomy performed. The neck was identified and a femoral neck cut carried out with a saw. The head was removed without difficulty and showed sclerotic femoral head and acetabulum. Reaming was carried out to 56 mm and a 56 mm cup trial gave appropriate tightness to the acetabular component a 56 DM cup was impacted into position. The leg was then externally rotated and ischiofemoral  and pubofemoral releases carried out. The femur was sequentially broached to a size 5, size 5 lateralized and then standard with S and L head trials were placed and the final components chosen. The 5 standard stem was inserted along with a ceramic L 28 mm head and 56 mm liner. The hip was reduced and was stable the wound was thoroughly irrigated with fibrillar placed along the posterior capsule and medial neck. The deep fascia ws closed using a heavy Quill after infiltration of 30 cc of quarter percent Sensorcaine with epinephrine Exparel was injected into the soft tissues throughout the procedure exposure and at the close of the case.  Marland Kitchen3-0 V-loc to close the skin with skin staples.  Incisional wound VAC applied, patient was sent to recovery in stable condition.   PLAN OF CARE: Admit to inpatient

## 2018-07-26 NOTE — Anesthesia Preprocedure Evaluation (Addendum)
Anesthesia Evaluation  Patient identified by MRN, date of birth, ID band Patient awake    Reviewed: Allergy & Precautions, H&P , NPO status , Patient's Chart, lab work & pertinent test results  History of Anesthesia Complications Negative for: history of anesthetic complications  Airway Mallampati: II  TM Distance: >3 FB     Dental  (+) Teeth Intact   Pulmonary neg shortness of breath, neg COPD, neg recent URI, former smoker,           Cardiovascular (-) hypertension(-) angina(-) CAD, (-) Past MI and (-) Cardiac Stents negative cardio ROS  (-) dysrhythmias      Neuro/Psych negative neurological ROS  negative psych ROS   GI/Hepatic negative GI ROS, Neg liver ROS,   Endo/Other  negative endocrine ROS  Renal/GU      Musculoskeletal   Abdominal   Peds  Hematology negative hematology ROS (+)   Anesthesia Other Findings Past Medical History: No date: Cancer ([place])     Comment:  bladder  Past Surgical History: No date: bladder tumor No date: CERVICAL DISC SURGERY No date: ROTATOR CUFF REPAIR; Right  BMI    Body Mass Index: 29.16 kg/m      Reproductive/Obstetrics negative OB ROS                            Anesthesia Physical Anesthesia Plan  ASA: II  Anesthesia Plan: Spinal   Post-op Pain Management:    Induction:   PONV Risk Score and Plan: Propofol infusion  Airway Management Planned: Simple Face Mask and Natural Airway  Additional Equipment:   Intra-op Plan:   Post-operative Plan:   Informed Consent: I have reviewed the patients History and Physical, chart, labs and discussed the procedure including the risks, benefits and alternatives for the proposed anesthesia with the patient or authorized representative who has indicated his/her understanding and acceptance.     Dental Advisory Given  Plan Discussed with: Anesthesiologist and CRNA  Anesthesia Plan Comments:        Anesthesia Quick Evaluation

## 2018-07-26 NOTE — Anesthesia Post-op Follow-up Note (Signed)
Anesthesia QCDR form completed.        

## 2018-07-26 NOTE — Anesthesia Procedure Notes (Signed)
Spinal  Patient location during procedure: OR Staffing Anesthesiologist: Fitzgerald, Kathryn L, MD Resident/CRNA: Arissa Fagin, CRNA Performed: resident/CRNA  Preanesthetic Checklist Completed: patient identified, site marked, surgical consent, pre-op evaluation, timeout performed, IV checked, risks and benefits discussed and monitors and equipment checked Spinal Block Patient position: sitting Prep: ChloraPrep and site prepped and draped Patient monitoring: heart rate, continuous pulse ox, blood pressure and cardiac monitor Approach: midline Location: L4-5 Injection technique: single-shot Needle Needle type: Introducer and Pencan  Needle gauge: 24 G Needle length: 9 cm Additional Notes Negative paresthesia. Negative blood return. Positive free-flowing CSF. Expiration date of kit checked and confirmed. Patient tolerated procedure well, without complications.       

## 2018-07-26 NOTE — H&P (Signed)
Chief Complaint  Patient presents with  . Establish Care    Lt hip pain, started in Nov 19'    History of the Present Illness: Raymond Duran is a 68 y.o. male here for follow-up of left hip pain.  The patient reports that his left hip pain began in 12/2017.  He has x-rays that show left hip osteoarthritis.  The MRI of the left hip shows significant osteoarthritis.  He has pain in his left groin and pain with walking.  He is now having difficulty sleeping due to pain.   He has history of prior cervical spine surgery with Dr. Harrell Gave I. Shaffrey.    I have reviewed past medical, surgical, social and family history, and allergies as documented in the EMR.   Past Medical History:     Past Medical History:  Diagnosis Date  . Allergic state   . 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 DDD (degenerative disc disease), cervical   . Migraines     Past Surgical History:      Past Surgical History:  Procedure Laterality Date  . Anterior Cervical Decompression and Fusion   2009  . COLONOSCOPY  10/31/2005   Dr. Jeb Levering @ [facility] - Multiple Hyperplastic Polyps  . 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 Shoulder Arthroscopic Rotator Cuff Right 2006  . TUR Surgery  2004    Past Family History: FamilyHistory       Family History  Problem Relation Age of Onset  . Colon cancer Mother   . Heart disease Father       Medications:       Current Outpatient Medications Ordered in Epic  Medication Sig Dispense Refill  . betamethasone dipropionate (DIPROLENE) 0.05 % cream Apply topically 2 (two) times daily 45 g 1  . fluticasone (FLONASE) 50 mcg/actuation nasal spray Place 2 sprays into both nostrils once daily.    Marland Kitchen loratadine (CLARITIN) 10 mg tablet Take 10 mg by mouth once daily.    . promethazine (PHENERGAN) 25 MG tablet Take 1 tablet (25 mg total) by mouth every 6 (six) hours as needed for Nausea 12 tablet 0   . SUMAtriptan (IMITREX) 100 MG tablet Take 100 mg by mouth once as needed for Migraine May take a second dose after 2 hours if needed.     No current Epic-ordered facility-administered medications on file.     Allergies:     Allergies  Allergen Reactions  . Penicillin Swelling     Body mass index is 31.37 kg/m.   Review of Systems: A comprehensive 14 point ROS was performed, reviewed, and the pertinent orthopaedic findings are documented in the HPI.      Vitals:   07/13/18 0926  BP: 138/84    General Physical Examination:  General/Constitutional: No apparent distress: well-nourished and well developed. Eyes: Pupils equal, round with synchronous movement. Lungs: Clear to auscultation HEENT: Normal Vascular: No edema, swelling or tenderness, except as noted in detailed exam. Cardiac:  Heart rate and rhythm is regular. Integumentary: No impressive skin lesions present, except as noted in detailed exam. Neuro/Psych: Normal mood and affect, oriented to person, place and time.   Musculoskeletal Examination: On exam, the patient has 20 degrees of internal rotation and 50 degrees of external rotation of the contralateral right hip.  His left hip has 10 degrees of internal rotation and 30 degrees of external rotation.    Lungs are clear.  Heart  rate and rhythm is normal.  HEENT is normal.   Radiographs: His recent imaging was reviewed with him today.  He also has a cam lesion on the left.      Assessment:   ICD-10-CM  1. Primary osteoarthritis of left hip M16.12     Plan: The patient has clinical findings of left hip osteoarthritis.    We discussed his condition and treatment options at length today.  I recommend he consider left total hip arthroplasty at this time.  An AAOS brochure for hip arthroplasty was given to him today.  The postoperative course was also explained to him.  He is relatively active and may require only a 1-2 night  hospital stay.  He will be scheduled for surgery in the near surgery, possibly in mid 07/2018.   Surgical Risks:  The nature of the condition and the proposed procedure has been reviewed in detail with the patient.  Surgical versus non-surgical options and prognosis for recovery have been reviewed and the inherent risks and benefits of each have been discussed including the risks of infection, bleeding, injury to nerves / blood vessels / tendons, incomplete relief of symptoms, persisting pain and / or stiffness, loss of function, complex regional pain syndrome, failure of procedure, as appropriate.  Teeth:  Normal   Scribe Attestation: I, Candie Mile, am acting as scribe for [employer], MD   Reviewed paper H+P, will be scanned into chart. No changes noted.

## 2018-07-27 LAB — BASIC METABOLIC PANEL
Anion gap: 5 (ref 5–15)
BUN: 9 mg/dL (ref 8–23)
CO2: 22 mmol/L (ref 22–32)
Calcium: 7.7 mg/dL — ABNORMAL LOW (ref 8.9–10.3)
Chloride: 103 mmol/L (ref 98–111)
Creatinine, Ser: 0.67 mg/dL (ref 0.61–1.24)
GFR calc Af Amer: >60 mL/min (ref >60)
GFR calc non Af Amer: >60 mL/min (ref >60)
Glucose, Bld: 121 mg/dL — ABNORMAL HIGH (ref 70–99)
Potassium: 3.8 mmol/L (ref 3.5–5.1)
Sodium: 130 mmol/L — ABNORMAL LOW (ref 135–145)

## 2018-07-27 LAB — CBC
HCT: 36.2 % — ABNORMAL LOW (ref 39.0–52.0)
Hemoglobin: 12.4 g/dL — ABNORMAL LOW (ref 13.0–17.0)
MCH: 31.7 pg (ref 26.0–34.0)
MCHC: 34.3 g/dL (ref 30.0–36.0)
MCV: 92.6 fL (ref 80.0–100.0)
Platelets: 163 10*3/uL (ref 150–400)
RBC: 3.91 MIL/uL — ABNORMAL LOW (ref 4.22–5.81)
RDW: 13.8 % (ref 11.5–15.5)
WBC: 9.4 10*3/uL (ref 4.0–10.5)
nRBC: 0 % (ref 0.0–0.2)

## 2018-07-27 LAB — SURGICAL PATHOLOGY

## 2018-07-27 MED ORDER — HYDROCODONE-ACETAMINOPHEN 7.5-325 MG PO TABS: 1.0000 | ORAL_TABLET | ORAL | 0 refills | Status: AC | PRN

## 2018-07-27 MED ORDER — DOCUSATE SODIUM 100 MG PO CAPS: 100.0000 mg | ORAL_CAPSULE | Freq: Two times a day (BID) | ORAL | 0 refills | Status: AC

## 2018-07-27 MED ORDER — ENOXAPARIN SODIUM 40 MG/0.4ML ~~LOC~~ SOLN: 40.0000 mg | SUBCUTANEOUS | 0 refills | Status: AC

## 2018-07-27 NOTE — Evaluation (Signed)
Occupational Therapy Evaluation Patient Details Name: Raymond Duran MRN: [id number] DOB: [birth year] Today's Date: 07/27/2018    History of Present Illness Raymond Duran is a 68 y.o. male S/P left THR, anterior approach and WBAT LLE.    Clinical Impression   Raymond Duran was seen for OT evaluation this date, POD#1 from above surgery. Pt was independent in all ADLs prior to surgery, however occasionally limited in his functional mobility 2/2 L hip pain. Pt was not using AE for mobility. Pt is eager to return to PLOF with less pain and improved safety and independence. Pt currently requires minimal assist for LB dressing and bathing while in seated position due to pain and limited AROM of L hip. Pt instructed in self care skills, falls prevention strategies, home/routines modifications, DME/AE for LB bathing and dressing tasks, and compression stocking mgt strategies. Pt eager to participate in therapy and return verbalized/demonstrated understanding of education provided. Pt would benefit from additional instruction in self care skills and techniques to help maintain precautions with or without assistive devices to support recall and carryover prior to discharge. Recommend HHOT upon discharge for maximized safety and functional return to meaningful occupations of daily life.      Follow Up Recommendations  Home health OT    Equipment Recommendations  Other (comment)(Long handle reacher, sock aid.)    Recommendations for Other Services       Precautions / Restrictions Precautions Precautions: Anterior Hip Precaution Booklet Issued: No Restrictions Weight Bearing Restrictions: Yes LLE Weight Bearing: Weight bearing as tolerated      Mobility Bed Mobility Overal bed mobility: Modified Independent             General bed mobility comments: Pt up in chair at start and end of session. Per PT note, this pt is Mod I with increased time/effort.  Transfers Overall transfer level:  Needs assistance Equipment used: Rolling walker (2 wheeled) Transfers: Sit to/from Stand Sit to Stand: Min guard         General transfer comment: Min vc for sequencing; Min guard for safety with STS. Pt supervision for ambulation within room on this date.    Balance Overall balance assessment: Mild deficits observed, not formally tested                                         ADL either performed or assessed with clinical judgement   ADL Overall ADL's : Needs assistance/impaired Eating/Feeding: Set up;Independent;Sitting   Grooming: Oral care;Standing;Cueing for safety;Wash/dry hands;Supervision/safety Grooming Details (indicate cue type and reason): Pt able to stand at room sink to complete grooming. Min VCs to slow movements/take his time. Upper Body Bathing: Sitting;Set up   Lower Body Bathing: Set up;Minimal assistance;Sitting/lateral leans   Upper Body Dressing : Set up;Sitting   Lower Body Dressing: Set up;Minimal assistance;Sit to/from stand Lower Body Dressing Details (indicate cue type and reason): Using RW for STS Toilet Transfer: RW;BSC;Supervision/safety   Toileting- Clothing Manipulation and Hygiene: Sit to/from stand;Supervision/safety       Functional mobility during ADLs: Supervision/safety;Cueing for safety;Rolling walker       Vision Baseline Vision/History: Wears glasses Wears Glasses: Reading only(& Night vision) Patient Visual Report: No change from baseline       Perception     Praxis      Pertinent Vitals/Pain Pain Assessment: Faces Faces Pain Scale: Hurts a little bit Pain Location: L Hip  Pain Descriptors / Indicators: Aching;Sore Pain Intervention(s): Limited activity within patient's tolerance;Monitored during session;Patient requesting pain meds-RN notified     Hand Dominance Right   Extremity/Trunk Assessment Upper Extremity Assessment Upper Extremity Assessment: Overall WFL for tasks assessed   Lower  Extremity Assessment Lower Extremity Assessment: LLE deficits/detail;Defer to PT evaluation LLE Coordination: decreased gross motor   Cervical / Trunk Assessment Cervical / Trunk Assessment: Normal   Communication Communication Communication: No difficulties   Cognition Arousal/Alertness: Awake/alert Behavior During Therapy: WFL for tasks assessed/performed Overall Cognitive Status: Within Functional Limits for tasks assessed                                 General Comments: Actively participating in session. Eager to learn about/trial AE.   General Comments  Wound vac in place start/end of session.    Exercises  Other Exercises: Pt educated in falls prevention strategies, safe use of AE for ADLs, home/routines modifications for improved safety and independence, safe transfer/mobility techniques, compression stocking mgt, and anterior hip precautions on this date. Pt verbalized and demonstrated understanding where applicable.   Shoulder Instructions      Home Living Family/patient expects to be discharged to:: Private residence Living Arrangements: Spouse/significant other Available Help at Discharge: Family;Available 24 hours/day Type of Home: House Home Access: Stairs to enter [employer] of Steps: 3 Entrance Stairs-Rails: Right Home Layout: Two level;Able to live on main level with bedroom/bathroom     Bathroom Shower/Tub: Occupational psychologist: Standard     Home Equipment: Shower seat;Shower seat - built in;Hand held shower head;Grab bars - tub/shower   Additional Comments: Pt has equipment from careing for mother available to use. Needs to be set up.      Prior Functioning/Environment Level of Independence: Independent        Comments: Pt ambulating w/o AD, driving, active in community.        OT Problem List: Decreased strength;Decreased coordination;Pain;Decreased range of motion;Decreased activity tolerance;Decreased  safety awareness;Impaired balance (sitting and/or standing);Decreased knowledge of precautions;Decreased knowledge of use of DME or AE      OT Treatment/Interventions: Self-care/ADL training;Balance training;Therapeutic exercise;Therapeutic activities;DME and/or AE instruction;Patient/family education    OT Goals(Current goals can be found in the care plan section) Acute Rehab OT Goals Patient Stated Goal: To go home and recover safely OT Goal Formulation: With patient Time For Goal Achievement: 08/10/18 Potential to Achieve Goals: Good ADL Goals Pt Will Perform Lower Body Bathing: with supervision;with adaptive equipment;sit to/from stand(With LRAD PRN for improved safety and functional independence.) Pt Will Perform Lower Body Dressing: with supervision;sit to/from stand;with adaptive equipment(With LRAD PRN for improved safety and functional independence) Pt Will Transfer to Toilet: ambulating;with supervision;regular height toilet(With LRAD PRN for improved safety and functional independence)  OT Frequency: Min 1X/week   Barriers to D/C:            Co-evaluation              AM-PAC OT "6 Clicks" Daily Activity     Outcome Measure Help from another person eating meals?: None Help from another person taking care of personal grooming?: A Little Help from another person toileting, which includes using toliet, bedpan, or urinal?: A Little Help from another person bathing (including washing, rinsing, drying)?: A Little Help from another person to put on and taking off regular upper body clothing?: A Little Help from another person to put on and  taking off regular lower body clothing?: A Lot 6 Click Score: 18   End of Session Equipment Utilized During Treatment: Gait belt;Rolling walker Nurse Communication: Patient requests pain meds  Activity Tolerance: Patient tolerated treatment well;No increased pain Patient left: in chair;with call bell/phone within reach;with chair alarm  set;with SCD's reapplied  OT Visit Diagnosis: Unsteadiness on feet (R26.81);Other abnormalities of gait and mobility (R26.89);Pain Pain - Right/Left: Left Pain - part of body: Hip                Time: 9794-8016 OT Time Calculation (min): 37 min Charges:  OT General Charges $OT Visit: 1 Visit OT Evaluation $OT Eval Low Complexity: 1 Low OT Treatments $Self Care/Home Management : 23-37 mins  Shara Blazing, M.S., OTR/L Ascom: [phone] 07/27/18, 2:10 PM

## 2018-07-27 NOTE — Anesthesia Postprocedure Evaluation (Signed)
Anesthesia Post Note  Patient: Raymond Duran  Procedure(s) Performed: TOTAL HIP ARTHROPLASTY LEFT (Left Hip)  Patient location during evaluation: Nursing Unit Anesthesia Type: Spinal Level of consciousness: awake and alert and oriented Pain management: pain level controlled Vital Signs Assessment: post-procedure vital signs reviewed and stable Respiratory status: spontaneous breathing and nonlabored ventilation Cardiovascular status: stable Postop Assessment: no headache, no backache, patient able to bend at knees, no apparent nausea or vomiting, able to ambulate and adequate PO intake Anesthetic complications: no     Last Vitals:  Vitals:   07/27/18 0151 07/27/18 0601  BP: (!) 137/96 134/76  Pulse: 89 95  Resp: 18 17  Temp: 36.8 C 36.4 C  SpO2: 93% 95%    Last Pain:  Vitals:   07/27/18 0601  TempSrc: Oral  PainSc:                  [name]

## 2018-07-27 NOTE — Progress Notes (Signed)
   Subjective: 1 Day Post-Op Procedure(s) (LRB): TOTAL HIP ARTHROPLASTY LEFT (Left) Patient reports pain as 3 on 0-10 scale.   Patient is well, and has had no acute complaints or problems Denies any CP, SOB, ABD pain. We will continue therapy today.  Plan is to go Home after hospital stay.  Objective: Vital signs in last 24 hours: Temp:  [97 F (36.1 C)-98.2 F (36.8 C)] 97.6 F (36.4 C) (06/12 0601) Pulse Rate:  [50-95] 95 (06/12 0601) Resp:  [16-27] 17 (06/12 0601) BP: (99-138)/(42-96) 134/76 (06/12 0601) SpO2:  [88 %-99 %] 95 % (06/12 0601)  Intake/Output from previous day: 06/11 0701 - 06/12 0700 In: 2219.5 [P.O.:360; I.V.:1809.5; IV Piggyback:50] Out: 3305 [Urine:3230; Blood:75] Intake/Output this shift: No intake/output data recorded.  Recent Labs    07/26/18 1041 07/27/18 0642  HGB 13.8 12.4*   Recent Labs    07/26/18 1041 07/27/18 0642  WBC 8.2 9.4  RBC 4.35 3.91*  HCT 41.3 36.2*  PLT 186 163   Recent Labs    07/26/18 1041 07/27/18 0642  NA  --  130*  K  --  3.8  CL  --  103  CO2  --  22  BUN  --  9  CREATININE 0.69 0.67  GLUCOSE  --  121*  CALCIUM  --  7.7*   No results for input(s): LABPT, INR in the last 72 hours.  EXAM General - Patient is Alert, Appropriate and Oriented Extremity - Neurovascular intact Sensation intact distally Intact pulses distally Dorsiflexion/Plantar flexion intact No cellulitis present Compartment soft Dressing - dressing C/D/I, no drainage and Praveena intact without drainage. Motor Function - intact, moving foot and toes well on exam.   Past Medical History:  Diagnosis Date  . Cancer ([place])    bladder    Assessment/Plan:   1 Day Post-Op Procedure(s) (LRB): TOTAL HIP ARTHROPLASTY LEFT (Left) Active Problems:   Status post total hip replacement, left  Estimated body mass index is 29.16 kg/m as calculated from the following:   Height as of this encounter: 6' (1.829 m).   Weight as of this encounter:  97.5 kg. Advance diet Up with therapy  Needs bowel movement Vital signs stable Pain controlled. Labs are stable Recheck labs in the morning Care manager to assist with discharge  DVT Prophylaxis - Lovenox, TED hose and SCDs Weight-Bearing as tolerated to left leg   T. Rachelle Hora, PA-C [place] 07/27/2018, 7:56 AM

## 2018-07-27 NOTE — Progress Notes (Signed)
Physical Therapy Treatment Patient Details Name: Raymond Duran MRN: [id number] DOB: March 11, 1950 Today's Date: 07/27/2018    History of Present Illness Raymond Duran is a 68 y.o. male S/P left THR, anterior approach and WBAT LLE.     PT Comments    Pt presents with deficits in strength, transfers, mobility, gait, balance, and activity tolerance but is progressing well towards goals.  Pt was able to sit/stand from various height surfaces with improved speed/control and was able to amb 150' x 2 with improving gait quality including increased cadence and RLE step length as well as decreased WB through the RW.  Pt was able to ascend and descend 2 steps both with B rails as well as with L rail only to simulate home situation.  Pt demonstrated very good control and stability during stair training.  Pt will benefit from HHPT services upon discharge to safely address above deficits for decreased caregiver assistance and eventual return to PLOF.     Follow Up Recommendations  Home health PT     Equipment Recommendations  Rolling walker with 5" wheels    Recommendations for Other Services       Precautions / Restrictions Precautions Precautions: Anterior Hip Precaution Booklet Issued: No Restrictions Weight Bearing Restrictions: Yes LLE Weight Bearing: Weight bearing as tolerated    Mobility  Bed Mobility Overal bed mobility: Modified Independent             General bed mobility comments: NT, pt in recliner  Transfers Overall transfer level: Needs assistance Equipment used: Rolling walker (2 wheeled) Transfers: Sit to/from Stand Sit to Stand: Supervision;Min guard         General transfer comment: Good eccentric and concentric control  Ambulation/Gait Ambulation/Gait assistance: Min guard Gait Distance (Feet): 150 Feet Assistive device: Rolling walker (2 wheeled) Gait Pattern/deviations: Step-through pattern;Step-to pattern;Decreased stance time -  left;Antalgic Gait velocity: decreased   General Gait Details: Antalgic step-to pattern that progressed quickly to beginning step-through pattern during the session   Stairs Stairs: Yes Stairs assistance: Min guard Stair Management: Two rails;One rail Left Number of Stairs: 2 General stair comments: Ascend/descend 2 steps x 2 once with B rails and once with L rail with min-mod verbal and visual cues for sequencing.  Pt demonstrated good eccentric and concentric control throughout.   Wheelchair Mobility    Modified Rankin (Stroke Patients Only)       Balance Overall balance assessment: Mild deficits observed, not formally tested                                          Cognition Arousal/Alertness: Awake/alert Behavior During Therapy: WFL for tasks assessed/performed Overall Cognitive Status: Within Functional Limits for tasks assessed                                 General Comments: Actively participating in session. Eager to learn about/trial AE.      Exercises Total Joint Exercises Ankle Circles/Pumps: AROM;Both;10 reps Quad Sets: Strengthening;Both;10 reps Gluteal Sets: Strengthening;Both;10 reps Long Arc Quad: Strengthening;Both;10 reps Knee Flexion: Strengthening;Both;10 reps Marching in Standing: AROM;Both;10 reps;Standing Other Exercises Other Exercises: HEP education and review Other Exercises: Anterior hip precaution education verbally and during sup to sit Other Exercises: Static standing at EOB and standing with weight shifting with cues for upright posture Other Exercises: Pt  educated in falls prevention strategies, safe use of AE for ADLs, home/routines modifications for improved safety and independence, safe transfer/mobility techniques, compression stocking mgt, and anterior hip precautions on this date. Pt verbalized and demonstrated understanding where applicable.    General Comments General comments (skin integrity,  edema, etc.): Wound vac in place start/end of session.      Pertinent Vitals/Pain Pain Assessment: 0-10 Pain Score: 2  Faces Pain Scale: Hurts a little bit Pain Location: L Hip Pain Descriptors / Indicators: Aching;Sore Pain Intervention(s): Monitored during session;Premedicated before session    [place] expects to be discharged to:: Private residence Living Arrangements: Spouse/significant other Available Help at Discharge: Family;Available 24 hours/day Type of Home: House Home Access: Stairs to enter Entrance Stairs-Rails: Right Home Layout: Two level;Able to live on main level with bedroom/bathroom Home Equipment: Shower seat;Shower seat - built in;Hand held shower head;Grab bars - tub/shower Additional Comments: Pt has equipment from careing for mother available to use. Needs to be set up.    Prior Function Level of Independence: Independent      Comments: Pt ambulating w/o AD, driving, active in community.   PT Goals (current goals can now be found in the care plan section) Acute Rehab PT Goals Patient Stated Goal: To go home and recover safely Progress towards PT goals: Progressing toward goals    Frequency    BID      PT Plan Current plan remains appropriate    Co-evaluation              AM-PAC PT "6 Clicks" Mobility   Outcome Measure  Help needed turning from your back to your side while in a flat bed without using bedrails?: None Help needed moving from lying on your back to sitting on the side of a flat bed without using bedrails?: None Help needed moving to and from a bed to a chair (including a wheelchair)?: A Little Help needed standing up from a chair using your arms (e.g., wheelchair or bedside chair)?: A Little Help needed to walk in hospital room?: A Little Help needed climbing 3-5 steps with a railing? : A Little 6 Click Score: 20    End of Session Equipment Utilized During Treatment: Gait belt Activity Tolerance:  Patient tolerated treatment well Patient left: in chair;with chair alarm set;with SCD's reapplied;with call bell/phone within reach Nurse Communication: Mobility status PT Visit Diagnosis: Muscle weakness (generalized) (M62.81);Difficulty in walking, not elsewhere classified (R26.2)     Time: 0092-3300 PT Time Calculation (min) (ACUTE ONLY): 27 min  Charges:  $Gait Training: 8-22 mins $Therapeutic Exercise: 8-22 mins $Therapeutic Activity: 8-22 mins                     D. Scott Arslan Kier PT, DPT 07/27/18, 2:38 PM

## 2018-07-27 NOTE — Discharge Instructions (Signed)
ANTERIOR APPROACH TOTAL HIP REPLACEMENT POSTOPERATIVE DIRECTIONS   Hip Rehabilitation, Guidelines Following Surgery  The results of a hip operation are greatly improved after range of motion and muscle strengthening exercises. Follow all safety measures which are given to protect your hip. If any of these exercises cause increased pain or swelling in your joint, decrease the amount until you are comfortable again. Then slowly increase the exercises. Call your caregiver if you have problems or questions.   HOME CARE INSTRUCTIONS  Remove items at home which could result in a fall. This includes throw rugs or furniture in walking pathways.   ICE to the affected hip every three hours for 30 minutes at a time and then as needed for pain and swelling.  Continue to use ice on the hip for pain and swelling from surgery. You may notice swelling that will progress down to the foot and ankle.  This is normal after surgery.  Elevate the leg when you are not up walking on it.    Continue to use the breathing machine which will help keep your temperature down.  It is common for your temperature to cycle up and down following surgery, especially at night when you are not up moving around and exerting yourself.  The breathing machine keeps your lungs expanded and your temperature down.  Do not place pillow under knee, focus on keeping the knee straight while resting  DIET You may resume your previous home diet once your are discharged from the hospital.  DRESSING / WOUND CARE / SHOWERING Please remove provena negative pressure dressing on 08/04/2018 and apply honey comb dressing. Keep dressing clean and dry at all times.  ACTIVITY Walk with your walker as instructed. Use walker as long as suggested by your caregivers. Avoid periods of inactivity such as sitting longer than an hour when not asleep. This helps prevent blood clots.  You may resume a sexual relationship in one month or when given the OK by  your doctor.  You may return to work once you are cleared by your doctor.  Do not drive a car for 6 weeks or until released by you surgeon.  Do not drive while taking narcotics.  WEIGHT BEARING Weight bearing as tolerated. Use walker/cane as needed for at least 4 weeks post op.  POSTOPERATIVE CONSTIPATION PROTOCOL Constipation - defined medically as fewer than three stools per week and severe constipation as less than one stool per week.  One of the most common issues patients have following surgery is constipation.  Even if you have a regular bowel pattern at home, your normal regimen is likely to be disrupted due to multiple reasons following surgery.  Combination of anesthesia, postoperative narcotics, change in appetite and fluid intake all can affect your bowels.  In order to avoid complications following surgery, here are some recommendations in order to help you during your recovery period.  Colace (docusate) - Pick up an over-the-counter form of Colace or another stool softener and take twice a day as long as you are requiring postoperative pain medications.  Take with a full glass of water daily.  If you experience loose stools or diarrhea, hold the colace until you stool forms back up.  If your symptoms do not get better within 1 week or if they get worse, check with your doctor.  Dulcolax (bisacodyl) - Pick up over-the-counter and take as directed by the product packaging as needed to assist with the movement of your bowels.  Take with a full  glass of water.  Use this product as needed if not relieved by Colace only.  ° °MiraLax (polyethylene glycol) - Pick up over-the-counter to have on hand.  MiraLax is a solution that will increase the amount of water in your bowels to assist with bowel movements.  Take as directed and can mix with a glass of water, juice, soda, coffee, or tea.  Take if you go more than two days without a movement. °Do not use MiraLax more than once per day. Call your  doctor if you are still constipated or irregular after using this medication for 7 days in a row. ° °If you continue to have problems with postoperative constipation, please contact the office for further assistance and recommendations.  If you experience "the worst abdominal pain ever" or develop nausea or vomiting, please contact the office immediatly for further recommendations for treatment. ° °ITCHING ° If you experience itching with your medications, try taking only a single pain pill, or even half a pain pill at a time.  You can also use Benadryl over the counter for itching or also to help with sleep.  ° °TED HOSE STOCKINGS °Wear the elastic stockings on both legs for six weeks following surgery during the day but you may remove then at night for sleeping. ° °MEDICATIONS °See your medication summary on the “After Visit Summary” that the nursing staff will review with you prior to discharge.  You may have some home medications which will be placed on hold until you complete the course of blood thinner medication.  It is important for you to complete the blood thinner medication as prescribed by your surgeon.  Continue your approved medications as instructed at time of discharge. ° °PRECAUTIONS °If you experience chest pain or shortness of breath - call 911 immediately for transfer to the hospital emergency department.  °If you develop a fever greater that 101 F, purulent drainage from wound, increased redness or drainage from wound, foul odor from the wound/dressing, or calf pain - CONTACT YOUR SURGEON.   °                                                °FOLLOW-UP APPOINTMENTS °Make sure you keep all of your appointments after your operation with your surgeon and caregivers. You should call the office at the above phone number and make an appointment for approximately two weeks after the date of your surgery or on the date instructed by your surgeon outlined in the "After Visit Summary". ° °RANGE OF MOTION  AND STRENGTHENING EXERCISES  °These exercises are designed to help you keep full movement of your hip joint. Follow your caregiver's or physical therapist's instructions. Perform all exercises about fifteen times, three times per day or as directed. Exercise both hips, even if you have had only one joint replacement. These exercises can be done on a training (exercise) mat, on the floor, on a table or on a bed. Use whatever works the best and is most comfortable for you. Use music or television while you are exercising so that the exercises are a pleasant break in your day. This will make your life better with the exercises acting as a break in routine you can look forward to.  °Lying on your back, slowly slide your foot toward your buttocks, raising your knee up off the floor. Then slowly   slide your foot back down until your leg is straight again.  Lying on your back spread your legs as far apart as you can without causing discomfort.  Lying on your side, raise your upper leg and foot straight up from the floor as far as is comfortable. Slowly lower the leg and repeat.  Lying on your back, tighten up the muscle in the front of your thigh (quadriceps muscles). You can do this by keeping your leg straight and trying to raise your heel off the floor. This helps strengthen the largest muscle supporting your knee.  Lying on your back, tighten up the muscles of your buttocks both with the legs straight and with the knee bent at a comfortable angle while keeping your heel on the floor.   IF YOU ARE TRANSFERRED TO A SKILLED REHAB FACILITY If the patient is transferred to a skilled rehab facility following release from the hospital, a list of the current medications will be sent to the facility for the patient to continue.  When discharged from the skilled rehab facility, please have the facility set up the patient's [place] prior to being released. Also, the skilled facility will be responsible  for providing the patient with their medications at time of release from the facility to include their pain medication, the muscle relaxants, and their blood thinner medication. If the patient is still at the rehab facility at time of the two week follow up appointment, the skilled rehab facility will also need to assist the patient in arranging follow up appointment in our office and any transportation needs.  MAKE SURE YOU:  Understand these instructions.  Get help right away if you are not doing well or get worse.    Pick up stool softner and laxative for home use following surgery while on pain medications. Continue to use ice for pain and swelling after surgery. Do not use any lotions or creams on the incision until instructed by your surgeon.

## 2018-07-27 NOTE — Discharge Summary (Signed)
Physician Discharge Summary  Patient ID: Raymond Duran MRN: [id number] DOB/AGE: 1950/05/04 68 y.o.  Admit date: 07/26/2018 Discharge date: 07/28/2018 Admission Diagnoses:  PRIMARY OSTEOARTHRITIS OF LEFT HIP   Discharge Diagnoses: Patient Active Problem List   Diagnosis Date Noted  . Status post total hip replacement, left 07/26/2018    Past Medical History:  Diagnosis Date  . Cancer [facility])    bladder     Transfusion: none   Consultants (if any):   Discharged Condition: Improved  Hospital Course: Raymond Duran is an 68 y.o. male who was admitted 07/26/2018 with a diagnosis of left hip osteoarthritis and went to the operating room on 07/26/2018 and underwent the above named procedures.    Surgeries: Procedure(s): TOTAL HIP ARTHROPLASTY LEFT on 07/26/2018 Patient tolerated the surgery well. Taken to PACU where she was stabilized and then transferred to the orthopedic floor.  Started on Lovenox 30 mg q 12 hrs. Foot pumps applied bilaterally at 80 mm. Heels elevated on bed with rolled towels. No evidence of DVT. Negative [place]. Physical therapy started on day #1 for gait training and transfer. OT started day #1 for ADL and assisted devices.  Patient's foley was d/c on day #1. Patient's IV  was d/c on day #2.  On post op day #2 patient was stable and ready for discharge to home with HHPT.  Implants:  Medacta AMIS 5 standard stem with ceramic L 28 mm head, 56 mm Mpact DM cup and liner  He was given perioperative antibiotics:  Anti-infectives (From admission, onward)   Start     Dose/Rate Route Frequency Ordered Stop   07/26/18 1400  clindamycin (CLEOCIN) IVPB 900 mg     900 mg 100 mL/hr over 30 Minutes Intravenous Every 6 hours 07/26/18 1014 07/27/18 0702   07/26/18 0618  clindamycin (CLEOCIN) 900 MG/50ML IVPB    Note to Pharmacy: Lorenza [place]   : cabinet override      07/26/18 0618 07/26/18 0739   07/25/18 2145  clindamycin (CLEOCIN) IVPB 900 mg     900 mg 100 mL/hr over  30 Minutes Intravenous  Once 07/25/18 2141 07/26/18 0759    .  He was given sequential compression devices, early ambulation, and Lovenox TEDs for DVT prophylaxis.  He benefited maximally from the hospital stay and there were no complications.    Recent vital signs:  Vitals:   07/27/18 0601 07/27/18 0807  BP: 134/76 139/84  Pulse: 95 91  Resp: 17 14  Temp: 97.6 F (36.4 C) 98 F (36.7 C)  SpO2: 95% 94%    Recent laboratory studies:  Lab Results  Component Value Date   HGB 12.4 (L) 07/27/2018   HGB 13.8 07/26/2018   HGB 14.0 07/23/2018   Lab Results  Component Value Date   WBC 9.4 07/27/2018   PLT 163 07/27/2018   Lab Results  Component Value Date   INR 0.9 07/23/2018   Lab Results  Component Value Date   NA 130 (L) 07/27/2018   K 3.8 07/27/2018   CL 103 07/27/2018   CO2 22 07/27/2018   BUN 9 07/27/2018   CREATININE 0.67 07/27/2018   GLUCOSE 121 (H) 07/27/2018    Discharge Medications:   Allergies as of 07/27/2018      Reactions   Penicillins    Did it involve swelling of the face/tongue/throat, SOB, or low BP? Yes Did it involve sudden or severe rash/hives, skin peeling, or any reaction on the inside of your mouth or nose? No Did you  need to seek medical attention at a hospital or doctor's office? No When did it last happen?Childhood If all above answers are "NO", may proceed with cephalosporin use.      Medication List    STOP taking these medications   acetaminophen 500 MG tablet Commonly known as: TYLENOL     TAKE these medications   betamethasone dipropionate 0.05 % cream Commonly known as: DIPROLENE Apply 1 application topically 2 (two) times a day.   docusate sodium 100 MG capsule Commonly known as: COLACE Take 1 capsule (100 mg total) by mouth 2 (two) times daily.   enoxaparin 40 MG/0.4ML injection Commonly known as: Lovenox Inject 0.4 mLs (40 mg total) into the skin daily for 14 days.   fluticasone 50 MCG/ACT nasal  spray Commonly known as: FLONASE Place 2 sprays into the nose daily.   HYDROcodone-acetaminophen 7.5-325 MG tablet Commonly known as: NORCO Take 1-2 tablets by mouth every 4 (four) hours as needed for severe pain (pain score 7-10).   loratadine 10 MG tablet Commonly known as: CLARITIN Take 10 mg by mouth daily.   promethazine 25 MG tablet Commonly known as: PHENERGAN Take 25 mg by mouth every 6 (six) hours as needed for nausea.   SUMAtriptan 100 MG tablet Commonly known as: IMITREX Take 100 mg by mouth See admin instructions. Take once as needed for migraine, may take second dose after 2 hours if needed            Durable Medical Equipment  (From admission, onward)         Start     Ordered   07/26/18 1015  DME Walker rolling  Once    Question:  Patient needs a walker to treat with the following condition  Answer:  Status post total hip replacement, left   07/26/18 1014   07/26/18 1015  DME 3 n 1  Once     07/26/18 1014   07/26/18 1015  DME Bedside commode  Once    Question:  Patient needs a bedside commode to treat with the following condition  Answer:  Status post total hip replacement, left   07/26/18 1014          Diagnostic Studies: Mr Hip Left Wo Contrast  Result Date: 07/03/2018 CLINICAL DATA:  Severe left hip pain for the past 5 months. History of bladder cancer. EXAM: MR OF THE LEFT HIP WITHOUT CONTRAST TECHNIQUE: Multiplanar, multisequence MR imaging was performed. No intravenous contrast was administered. COMPARISON:  None. FINDINGS: Bones: There is no evidence of acute fracture, dislocation or avascular necrosis. 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 The visualized sacroiliac joints and symphysis pubis appear normal. Articular cartilage and labrum Articular cartilage: Scattered partial-thickness cartilage loss in the left hip joint with areas of full-thickness cartilage loss superiorly. Subchondral marrow edema in the acetabulum and femoral  head. Mild partial-thickness cartilage loss in the right hip joint. No subchondral signal abnormality. Labrum: Degenerative tearing of the left superior labrum. Joint or bursal effusion Joint effusion: Trace left hip joint effusion with synovitis. Bursae: No focal periarticular fluid collection. Muscles and tendons Muscles and tendons: The visualized gluteus, hamstring and iliopsoas tendons appear normal. No muscle edema or atrophy. Other findings Miscellaneous: Enlarged prostate with central gland hypertrophy indenting the bladder base. The visualized internal pelvic contents otherwise appear unremarkable. IMPRESSION: 1. 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 2. 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 Electronically Signed   By: Titus Dubin M.D.   On:  07/03/2018 08:59   Dg Hip Operative Unilat W Or W/o Pelvis Left  Result Date: 07/26/2018 CLINICAL DATA:  Left hip replacement EXAM: OPERATIVE LEFT HIP WITH PELVIS COMPARISON:  None. FLUOROSCOPY TIME:  Radiation Exposure Index (as provided by the fluoroscopic device): 5.8 mGy If the device does not provide the exposure index: Fluoroscopy Time:  12 seconds Number of Acquired Images:  5 FINDINGS: Initial images again demonstrate degenerative change of the left hip. Interval placement of left hip prosthesis is noted. No acute abnormality is noted. IMPRESSION: Status post left hip replacement Electronically Signed   By: Inez Catalina M.D.   On: 07/26/2018 09:09   Dg Hip Unilat W Or W/o Pelvis 2-3 Views Left  Result Date: 07/26/2018 CLINICAL DATA:  Post op left total hip replacement EXAM: DG HIP (WITH OR WITHOUT PELVIS) 2-3V LEFT COMPARISON:  MRI 07/02/2018 FINDINGS: Interval LEFT total hip arthroplasty. Surgical staples noted. No fracture or dislocation IMPRESSION: No complication associated with LEFT hip arthroplasty. Electronically Signed   By: Suzy Bouchard M.D.   On: 07/26/2018 10:07    Disposition:     Follow-up Information     Duanne Guess, PA-C Follow up in 2 week(s).   Specialties: Orthopedic Surgery, Emergency Medicine Contact information: [address] [place] [address] [phone]            Signed: Feliberto Gottron 07/27/2018, 12:53 PM

## 2018-07-27 NOTE — TOC Initial Note (Signed)
Transition of Care [facility]) - Initial/Assessment Note    Patient Details  Name: Raymond Duran MRN: [id number] Date of Birth: 23-Mar-1950  Transition [facility]) CM/SW Contact:    Tahliyah Anagnos, Lenice Llamas Phone Number: [phone]  07/27/2018, 4:23 PM  Clinical Narrative: PT is recommending home health and patient's surgeon's office set up home health with [facility] prior to hospital stay. Per Helene Kelp [facility] home health representative she can accept patient. Clinical Social Worker (CSW) met with patient and made him aware of above. Patient is alert and oriented X4 and was sitting up in the chair at bedside. Patient is agreeable to [facility] and requested a rolling walker and bedside commode. [employer] agency representative is aware of above and will deliver equipment to patient. Per patient he lives in [place] with his wife Raymond Duran of 66 years. Per patient his wife will provide 24/7 support. CSW made patient aware that his Lovenox will be $90. Patient verbalized his understanding. CSW will continue to follow and assist as needed.         Expected Discharge Plan: [place] Barriers to Discharge: Continued Medical Work up   Patient Goals and CMS Choice Patient states their goals for this hospitalization and ongoing recovery are:: Pain control CMS Medicare.gov Compare Post Acute Care list provided to:: Other (Comment Required)(Surgeon's office set up home health with [facility] prior to hospital stay.) Choice offered to / list presented to : NA  Expected Discharge Plan and Services Expected Discharge Plan: [place] In-house Referral: Clinical Social Work Discharge Planning Services: CM Consult   Living arrangements for the past 2 months: [place]                 DME Arranged: Walker rolling, 3-N-1 DME Agency: [employer] Date DME Agency Contacted: 07/27/18 Time DME Agency Contacted: 62 Representative spoke with at DME Agency: [place]:  PT [address]: [facility] [employer] Date [place]: 07/26/18   Representative spoke with at [place]: [place] Arrangements/Services Living arrangements for the past 2 months: [place] with:: Spouse Patient language and need for interpreter reviewed:: No Do you feel safe going back to the place where you live?: Yes      Need for Family Participation in Patient Care: No (Comment) Care giver support system in place?: Yes (comment)   Criminal Activity/Legal Involvement Pertinent to Current Situation/Hospitalization: No - Comment as needed  Activities of Daily Living Home Assistive Devices/Equipment: None ADL Screening (condition at time of admission) Patient's cognitive ability adequate to safely complete daily activities?: Yes Is the patient deaf or have difficulty hearing?: No Does the patient have difficulty seeing, even when wearing glasses/contacts?: No Does the patient have difficulty concentrating, remembering, or making decisions?: No Patient able to express need for assistance with ADLs?: Yes Does the patient have difficulty dressing or bathing?: No Independently performs ADLs?: Yes (appropriate for developmental age) Does the patient have difficulty walking or climbing stairs?: No Weakness of Legs: None Weakness of Arms/Hands: None  Permission Sought/Granted Permission sought to share information with : Other (comment)(Home Health and DME agency) Permission granted to share information with : Yes, Verbal Permission Granted              Emotional Assessment Appearance:: Appears stated age   Affect (typically observed): Pleasant Orientation: : Oriented to Self, Oriented to Place, Oriented to  Time, Oriented to Situation Alcohol /  Substance Use: Not Applicable Psych Involvement: No (comment)  Admission diagnosis:  PRIMARY OSTEOARTHRITIS OF LEFT HIP Patient Active Problem List   Diagnosis Date Noted  .  Status post total hip replacement, left 07/26/2018   PCP:  Default, Provider, MD Pharmacy:   [facility] [address], [place] - [place] [address] [place] [address] Phone: [phone] Fax: [phone]     Social Determinants of Health (SDOH) Interventions    Readmission Risk Interventions No flowsheet data found.

## 2018-07-27 NOTE — Progress Notes (Signed)
Physical Therapy Treatment Patient Details Name: Raymond Duran MRN: [id number] DOB: Nov 28, 1950 Today's Date: 07/27/2018    History of Present Illness Raymond Duran is a 68 y.o. male S/P left THR, anterior approach and WBAT LLE.     PT Comments    Pt presents with deficits in strength, transfers, mobility, gait, balance, and activity tolerance.  Pt required a significant increase in time and effort with bed mobility tasks but ultimately did not require physical assistance.  Pt was able to perform multiple sit to/from stand transfers from multiple height surfaces with cues for sequencing and CGA.  Upon initial stand pt was very antalgic on the LLE but after gentle standing therex the pt's pain decreased and the pt was able to amb 100' with a RW with gradually improving RLE step length and cadence.  Pt will benefit from HHPT services upon discharge to safely address above deficits for decreased caregiver assistance and eventual return to PLOF.     Follow Up Recommendations  Home health PT     Equipment Recommendations  Rolling walker with 5" wheels    Recommendations for Other Services       Precautions / Restrictions Precautions Precautions: Anterior Hip Restrictions Weight Bearing Restrictions: Yes LLE Weight Bearing: Weight bearing as tolerated    Mobility  Bed Mobility Overal bed mobility: Modified Independent             General bed mobility comments: Extra time and effort with bed mobility tasks but no physical assistance required  Transfers Overall transfer level: Needs assistance Equipment used: Rolling walker (2 wheeled) Transfers: Sit to/from Stand Sit to Stand: Min guard         General transfer comment: Min vc for sequencing  Ambulation/Gait Ambulation/Gait assistance: Min guard Gait Distance (Feet): 100 Feet Assistive device: Rolling walker (2 wheeled) Gait Pattern/deviations: Step-through pattern;Step-to pattern;Decreased stance time -  left;Antalgic Gait velocity: decreased   General Gait Details: Antalgic step-to pattern that slowly progressed to beginning step-through pattern during the session   Stairs             Wheelchair Mobility    Modified Rankin (Stroke Patients Only)       Balance Overall balance assessment: Mild deficits observed, not formally tested                                          Cognition Arousal/Alertness: Awake/alert Behavior During Therapy: WFL for tasks assessed/performed Overall Cognitive Status: Within Functional Limits for tasks assessed                                        Exercises Total Joint Exercises Ankle Circles/Pumps: AROM;Strengthening;Both;10 reps Quad Sets: Strengthening;Both;10 reps Gluteal Sets: Strengthening;Both;10 reps Long Arc Quad: Strengthening;Both;10 reps;15 reps Knee Flexion: Strengthening;Both;10 reps;15 reps Marching in Standing: AROM;Both;10 reps;Standing Other Exercises Other Exercises: HEP education and review Other Exercises: Anterior hip precaution education verbally and during sup to sit Other Exercises: Static standing at EOB and standing with weight shifting with cues for upright posture    General Comments        Pertinent Vitals/Pain Pain Assessment: Faces Faces Pain Scale: Hurts little more Pain Location: L hip Pain Descriptors / Indicators: Aching;Sore Pain Intervention(s): Monitored during session;Premedicated before session    Home Living  Prior Function            PT Goals (current goals can now be found in the care plan section) Progress towards PT goals: Progressing toward goals    Frequency    BID      PT Plan Current plan remains appropriate    Co-evaluation              AM-PAC PT "6 Clicks" Mobility   Outcome Measure  Help needed turning from your back to your side while in a flat bed without using bedrails?: None Help  needed moving from lying on your back to sitting on the side of a flat bed without using bedrails?: None Help needed moving to and from a bed to a chair (including a wheelchair)?: A Little Help needed standing up from a chair using your arms (e.g., wheelchair or bedside chair)?: A Little Help needed to walk in hospital room?: A Little Help needed climbing 3-5 steps with a railing? : A Little 6 Click Score: 20    End of Session Equipment Utilized During Treatment: Gait belt Activity Tolerance: Patient tolerated treatment well Patient left: in chair;with chair alarm set;with SCD's reapplied;with call bell/phone within reach Nurse Communication: Mobility status PT Visit Diagnosis: Muscle weakness (generalized) (M62.81);Difficulty in walking, not elsewhere classified (R26.2)     Time: 1006-1050 PT Time Calculation (min) (ACUTE ONLY): 44 min  Charges:  $Gait Training: 8-22 mins $Therapeutic Exercise: 8-22 mins $Therapeutic Activity: 8-22 mins                     D. Scott Saleha Kalp PT, DPT 07/27/18, 1:28 PM

## 2018-07-28 LAB — BASIC METABOLIC PANEL
Anion gap: 10 (ref 5–15)
BUN: 9 mg/dL (ref 8–23)
CO2: 22 mmol/L (ref 22–32)
Calcium: 8.3 mg/dL — ABNORMAL LOW (ref 8.9–10.3)
Chloride: 100 mmol/L (ref 98–111)
Creatinine, Ser: 0.72 mg/dL (ref 0.61–1.24)
GFR calc Af Amer: >60 mL/min (ref >60)
GFR calc non Af Amer: >60 mL/min (ref >60)
Glucose, Bld: 105 mg/dL — ABNORMAL HIGH (ref 70–99)
Potassium: 3.9 mmol/L (ref 3.5–5.1)
Sodium: 132 mmol/L — ABNORMAL LOW (ref 135–145)

## 2018-07-28 LAB — CBC
HCT: 37.4 % — ABNORMAL LOW (ref 39.0–52.0)
Hemoglobin: 12.6 g/dL — ABNORMAL LOW (ref 13.0–17.0)
MCH: 31.7 pg (ref 26.0–34.0)
MCHC: 33.7 g/dL (ref 30.0–36.0)
MCV: 94.2 fL (ref 80.0–100.0)
Platelets: 149 10*3/uL — ABNORMAL LOW (ref 150–400)
RBC: 3.97 MIL/uL — ABNORMAL LOW (ref 4.22–5.81)
RDW: 13.5 % (ref 11.5–15.5)
WBC: 9 10*3/uL (ref 4.0–10.5)
nRBC: 0 % (ref 0.0–0.2)

## 2018-07-28 MED ORDER — BISACODYL 10 MG RE SUPP
10.0000 mg | Freq: Once | RECTAL | Status: AC
  Administered 2018-07-28: 10:00:00 10 mg via RECTAL
  Filled 2018-07-28: qty 1

## 2018-07-28 NOTE — Progress Notes (Signed)
Patient discharging home. Instructions and prescriptions given to patient, verbalized understanding. Wife coming to pick up patient to transport home.

## 2018-07-28 NOTE — Progress Notes (Signed)
   Subjective: 2 Days Post-Op Procedure(s) (LRB): TOTAL HIP ARTHROPLASTY LEFT (Left) Patient reports pain as 1 on 0-10 scale.   Patient is well, and has had no acute complaints or problems Denies any CP, SOB, ABD pain. We will continue therapy today.  Plan is to go Home after hospital stay.  Objective: Vital signs in last 24 hours: Temp:  [98 F (36.7 C)-98.6 F (37 C)] 98.3 F (36.8 C) (06/13 0730) Pulse Rate:  [84-90] 90 (06/13 0730) Resp:  [14-18] 18 (06/13 0730) BP: (132-141)/(81-84) 141/84 (06/13 0730) SpO2:  [92 %-96 %] 92 % (06/13 0730)  Intake/Output from previous day: 06/12 0701 - 06/13 0700 In: 600 [P.O.:600] Out: 700 [Urine:700] Intake/Output this shift: No intake/output data recorded.  Recent Labs    07/26/18 1041 07/27/18 0642 07/28/18 0455  HGB 13.8 12.4* 12.6*   Recent Labs    07/27/18 0642 07/28/18 0455  WBC 9.4 9.0  RBC 3.91* 3.97*  HCT 36.2* 37.4*  PLT 163 149*   Recent Labs    07/27/18 0642 07/28/18 0455  NA 130* 132*  K 3.8 3.9  CL 103 100  CO2 22 22  BUN 9 9  CREATININE 0.67 0.72  GLUCOSE 121* 105*  CALCIUM 7.7* 8.3*   No results for input(s): LABPT, INR in the last 72 hours.  EXAM General - Patient is Alert, Appropriate and Oriented Extremity - Neurovascular intact Sensation intact distally Intact pulses distally Dorsiflexion/Plantar flexion intact No cellulitis present Compartment soft Dressing - dressing C/D/I, no drainage and Praveena intact without drainage. Motor Function - intact, moving foot and toes well on exam.   Past Medical History:  Diagnosis Date  . Cancer ([place])    bladder    Assessment/Plan:   2 Days Post-Op Procedure(s) (LRB): TOTAL HIP ARTHROPLASTY LEFT (Left) Active Problems:   Status post total hip replacement, left  Estimated body mass index is 29.16 kg/m as calculated from the following:   Height as of this encounter: 6' (1.829 m).   Weight as of this encounter: 97.5 kg. Advance diet Up  with therapy  Needs bowel movement Vital signs stable Pain controlled. Labs are stable Care manager to assist with discharge to home with home health PT today pending bowel movement  DVT Prophylaxis - Lovenox, TED hose and SCDs Weight-Bearing as tolerated to left leg   T. Rachelle Hora, PA-C [place] 07/28/2018, 9:24 AM

## 2018-07-28 NOTE — Progress Notes (Signed)
Physical Therapy Treatment Patient Details Name: Raymond Duran MRN: [id number] DOB: January 15, 1951 Today's Date: 07/28/2018    History of Present Illness L THR    PT Comments    Participated in exercises as described below.  To edge of bed with rail and increased time.  Once sitting, steady.  Stood and was able to ambulate to rehab gym to complete stair training review.  Up/down with L rail with ease.  Pt felt more confident today with steps.  Returned to room and stayed up in recliner.  Reviewed HEP and discharge plan.  Questions answered.  He did have some burning pain initially upon standing but it resolved with ambulation/time.   Follow Up Recommendations  Home health PT     Equipment Recommendations  Rolling walker with 5" wheels;3in1 (PT)    Recommendations for Other Services       Precautions / Restrictions Precautions Precautions: Anterior Hip Precaution Booklet Issued: No Restrictions Weight Bearing Restrictions: Yes LLE Weight Bearing: Weight bearing as tolerated    Mobility  Bed Mobility Overal bed mobility: Needs Assistance Bed Mobility: Supine to Sit     Supine to sit: Min guard     General bed mobility comments: NT, pt in recliner  Transfers Overall transfer level: Needs assistance Equipment used: Rolling walker (2 wheeled) Transfers: Stand Pivot Transfers Sit to Stand: Min guard            Ambulation/Gait Ambulation/Gait assistance: Min guard Gait Distance (Feet): 200 Feet Assistive device: Rolling walker (2 wheeled) Gait Pattern/deviations: Step-to pattern Gait velocity: decreased   General Gait Details: continues with step -to - pattern.  uanble to progress to step though despite cues.   Stairs Stairs: Yes Stairs assistance: Min guard Stair Management: One rail Left;Step to pattern Number of Stairs: 4 General stair comments: with ease and good safety   Wheelchair Mobility    Modified Rankin (Stroke Patients Only)        Balance Overall balance assessment: Needs assistance Sitting-balance support: Feet supported Sitting balance-Leahy Scale: Good     Standing balance support: Bilateral upper extremity supported Standing balance-Leahy Scale: Fair                              Cognition Arousal/Alertness: Awake/alert Behavior During Therapy: WFL for tasks assessed/performed Overall Cognitive Status: Within Functional Limits for tasks assessed                                        Exercises Other Exercises Other Exercises: Heel slides, ab/add, ankle pumps, quad and glut sets in supine x 10 L LE Other Exercises: further instruction in AE/DME for ADL tasks including seated sponge bath until able to take full shower and LB dressing    General Comments        Pertinent Vitals/Pain Pain Assessment: Faces Faces Pain Scale: Hurts little more Pain Descriptors / Indicators: Burning Pain Intervention(s): Limited activity within patient's tolerance;Monitored during session    Home Living                      Prior Function            PT Goals (current goals can now be found in the care plan section) Acute Rehab PT Goals Patient Stated Goal: To go home and recover safely Progress towards PT goals: Progressing toward  goals    Frequency    BID      PT Plan Current plan remains appropriate    Co-evaluation              AM-PAC PT "6 Clicks" Mobility   Outcome Measure  Help needed turning from your back to your side while in a flat bed without using bedrails?: A Little Help needed moving from lying on your back to sitting on the side of a flat bed without using bedrails?: A Little Help needed moving to and from a bed to a chair (including a wheelchair)?: A Little Help needed standing up from a chair using your arms (e.g., wheelchair or bedside chair)?: A Little Help needed to walk in hospital room?: A Little Help needed climbing 3-5 steps with a  railing? : A Little 6 Click Score: 18    End of Session Equipment Utilized During Treatment: Gait belt Activity Tolerance: Patient tolerated treatment well Patient left: with call bell/phone within reach;in chair;with chair alarm set         Time: 7517-0017 PT Time Calculation (min) (ACUTE ONLY): 28 min  Charges:  $Gait Training: 8-22 mins $Therapeutic Exercise: 8-22 mins                     Chesley Noon, PTA 07/28/18, 12:04 PM

## 2018-07-28 NOTE — Progress Notes (Signed)
Occupational Therapy Treatment Patient Details Name: Raymond Duran MRN: [id number] DOB: Jun 20, 1950 Today's Date: 07/28/2018    History of present illness Raymond Duran is a 68 y.o. male S/P left THR, anterior approach and WBAT LLE.    OT comments  Pt seen for OT tx this date. Pt eager to participate, denies pain. Further instruction in AE/DME for ADL tasks including seated sponge bath until able to take full shower and LB dressing. Pt verbalizes understanding, politely declines to trial himself. Pt verbalizes plan for spouse's assist and set up of bathroom for ADL tasks. Pt encouraged to use BSC with lid down to sit on near sink in bathroom for seated sponge bath prior to full showers after the staples are removed. Pt verbalized understanding. Continues to benefit from skilled OT while hospitalized. Discharge recs updated to reflect progress. Do not anticipate skilled OT upon discharge. RNCM notified.   Follow Up Recommendations  No OT follow up    Equipment Recommendations  Other (comment)(long handled reacher, sock aide)    Recommendations for Other Services      Precautions / Restrictions Precautions Precautions: Anterior Hip Precaution Booklet Issued: No Restrictions Weight Bearing Restrictions: Yes LLE Weight Bearing: Weight bearing as tolerated       Mobility Bed Mobility               General bed mobility comments: NT, pt in recliner  Transfers                      Balance Overall balance assessment: Mild deficits observed, not formally tested                                         ADL either performed or assessed with clinical judgement   ADL Overall ADL's : Needs assistance/impaired                                       General ADL Comments: Min A for LB ADL, spouse able to provide     Vision Baseline Vision/History: Wears glasses Wears Glasses: Reading only Patient Visual Report: No change from  baseline     Perception     Praxis      Cognition Arousal/Alertness: Awake/alert Behavior During Therapy: WFL for tasks assessed/performed Overall Cognitive Status: Within Functional Limits for tasks assessed                                          Exercises Other Exercises Other Exercises: further instruction in AE/DME for ADL tasks including seated sponge bath until able to take full shower and LB dressing   Shoulder Instructions       General Comments      Pertinent Vitals/ Pain       Pain Assessment: No/denies pain  Home Living                                          Prior Functioning/Environment              Frequency  Min 1X/week  Progress Toward Goals  OT Goals(current goals can now be found in the care plan section)  Progress towards OT goals: Progressing toward goals  Acute Rehab OT Goals Patient Stated Goal: To go home and recover safely OT Goal Formulation: With patient Time For Goal Achievement: 08/10/18 Potential to Achieve Goals: Good  Plan Discharge plan needs to be updated;Frequency remains appropriate    Co-evaluation                 AM-PAC OT "6 Clicks" Daily Activity     Outcome Measure   Help from another person eating meals?: None Help from another person taking care of personal grooming?: None Help from another person toileting, which includes using toliet, bedpan, or urinal?: A Little Help from another person bathing (including washing, rinsing, drying)?: A Little Help from another person to put on and taking off regular upper body clothing?: None Help from another person to put on and taking off regular lower body clothing?: A Little 6 Click Score: 21    End of Session    OT Visit Diagnosis: Unsteadiness on feet (R26.81);Other abnormalities of gait and mobility (R26.89);Pain Pain - Right/Left: Left Pain - part of body: Hip   Activity Tolerance Patient tolerated  treatment well;No increased pain   Patient Left in chair;with call bell/phone within reach;with chair alarm set;with SCD's reapplied   Nurse Communication          Time: 1010-1023 OT Time Calculation (min): 13 min  Charges: OT General Charges $OT Visit: 1 Visit OT Treatments $Self Care/Home Management : 8-22 mins  Jeni Salles, MPH, MS, OTR/L ascom [phone] 07/28/18, 11:16 AM

## 2018-07-28 NOTE — TOC Transition Note (Signed)
Transition of Care [facility]) - CM/SW Discharge Note   Patient Details  Name: Raymond Duran MRN: [id number] Date of Birth: 1950/09/28  [facility] of [facility]) CM/SW Contact:  Latanya Maudlin, RN Phone Number: 07/28/2018, 9:43 AM   Clinical Narrative:  Patient to be discharged per MD order. Orders in place for home health services. Patient was previously set up with home health via [facility]. Notified Helene Kelp of pending discharge. Rolling walker and bedside commode was delivered to the bedside via [employer]. Family to transport.      Final next level of care: [place] Barriers to Discharge: No Barriers Identified   Patient Goals and CMS Choice Patient states their goals for this hospitalization and ongoing recovery are:: Pain control CMS Medicare.gov Compare Post Acute Care list provided to:: Patient Choice offered to / list presented to : Patient  Discharge Placement                       Discharge Plan and Services In-house Referral: Clinical Social Work Discharge Planning Services: CM Consult            DME Arranged: Gilford Rile rolling, 3-N-1 DME Agency: [facility] Date DME Agency Contacted: 07/27/18 Time DME Agency Contacted: [phone] Representative spoke with at DME Agency: [place]: PT [place]: [facility]) Date [place]: 07/28/18 Time [place]: [phone] Representative spoke with at [place]: [place] ([place]) Interventions     Readmission Risk Interventions Readmission Risk Prevention Plan 07/28/2018  Post Dischage Appt Complete  Medication Screening Complete  Transportation Screening Complete

## 2018-07-31 DIAGNOSIS — Z8551 Personal history of malignant neoplasm of bladder: Secondary | ICD-10-CM | POA: Diagnosis not present

## 2018-07-31 DIAGNOSIS — M5030 Other cervical disc degeneration, unspecified cervical region: Secondary | ICD-10-CM | POA: Diagnosis not present

## 2018-07-31 DIAGNOSIS — Z471 Aftercare following joint replacement surgery: Secondary | ICD-10-CM | POA: Diagnosis not present

## 2018-07-31 DIAGNOSIS — Z96642 Presence of left artificial hip joint: Secondary | ICD-10-CM | POA: Diagnosis not present

## 2018-07-31 DIAGNOSIS — G43909 Migraine, unspecified, not intractable, without status migrainosus: Secondary | ICD-10-CM | POA: Diagnosis not present

## 2018-09-05 DIAGNOSIS — M1612 Unilateral primary osteoarthritis, left hip: Secondary | ICD-10-CM | POA: Diagnosis not present

## 2018-11-06 DIAGNOSIS — H2513 Age-related nuclear cataract, bilateral: Secondary | ICD-10-CM | POA: Diagnosis not present

## 2019-03-28 ENCOUNTER — Ambulatory Visit: Payer: PPO | Attending: Internal Medicine

## 2019-03-28 DIAGNOSIS — Z23 Encounter for immunization: Secondary | ICD-10-CM

## 2019-03-28 NOTE — Progress Notes (Signed)
   Covid-19 Vaccination Clinic  Name:  Windom Balser    MRN: [id number] DOB: 04/08/1950  03/28/2019  Mr. Winning was observed post Covid-19 immunization for 15 minutes without incidence. He was provided with Vaccine Information Sheet and instruction to access the V-Safe system.   Mr. Loud was instructed to call 911 with any severe reactions post vaccine: Marland Kitchen Difficulty breathing  . Swelling of your face and throat  . A fast heartbeat  . A bad rash all over your body  . Dizziness and weakness    Immunizations Administered    Name Date Dose VIS Date Route   Pfizer COVID-19 Vaccine 03/28/2019  2:46 PM 0.3 mL 01/25/2019 Intramuscular   Manufacturer: [place]   Lot: [id number]   [place]: [id number]

## 2019-04-20 ENCOUNTER — Ambulatory Visit: Payer: PPO | Attending: Internal Medicine

## 2019-04-20 DIAGNOSIS — Z23 Encounter for immunization: Secondary | ICD-10-CM | POA: Insufficient documentation

## 2019-04-20 NOTE — Progress Notes (Signed)
   Covid-19 Vaccination Clinic  Name:  Amarious Berst    MRN: [id number] DOB: Oct 15, 1950  04/20/2019  Mr. Vancott was observed post Covid-19 immunization for 15 minutes without incident. He was provided with Vaccine Information Sheet and instruction to access the V-Safe system.   Mr. Tait was instructed to call 911 with any severe reactions post vaccine: Marland Kitchen Difficulty breathing  . Swelling of face and throat  . A fast heartbeat  . A bad rash all over body  . Dizziness and weakness   Immunizations Administered    Name Date Dose VIS Date Route   Pfizer COVID-19 Vaccine 04/20/2019  1:14 PM 0.3 mL 01/25/2019 Intramuscular   Manufacturer: [place]   Lot: [id number]   [place]: [id number]

## 2019-05-20 DIAGNOSIS — E7800 Pure hypercholesterolemia, unspecified: Secondary | ICD-10-CM | POA: Diagnosis not present

## 2019-05-20 DIAGNOSIS — Z79899 Other long term (current) drug therapy: Secondary | ICD-10-CM | POA: Diagnosis not present

## 2019-05-20 DIAGNOSIS — Z125 Encounter for screening for malignant neoplasm of prostate: Secondary | ICD-10-CM | POA: Diagnosis not present

## 2019-05-20 DIAGNOSIS — R739 Hyperglycemia, unspecified: Secondary | ICD-10-CM | POA: Diagnosis not present

## 2019-05-20 DIAGNOSIS — Z0000 Encounter for general adult medical examination without abnormal findings: Secondary | ICD-10-CM | POA: Diagnosis not present

## 2019-05-20 DIAGNOSIS — Z1331 Encounter for screening for depression: Secondary | ICD-10-CM | POA: Diagnosis not present

## 2019-09-24 IMAGING — MR MRI OF THE LEFT HIP WITHOUT CONTRAST
4 of 7 series · 19 of 40 positions shown · non-contrast
Comparison: None.

CLINICAL DATA: Severe left hip pain for the past 5 months. History
of bladder cancer.

EXAM:
MR OF THE LEFT HIP WITHOUT CONTRAST
TECHNIQUE: Multiplanar, multisequence MR imaging was performed. No intravenous
contrast was administered.

[Series 1: 3-plane loc · axial · 5.0mm · 0.78mm/px · z∈[-20,+200]mm · 3 of 15 slices shown]
[im 1/15]
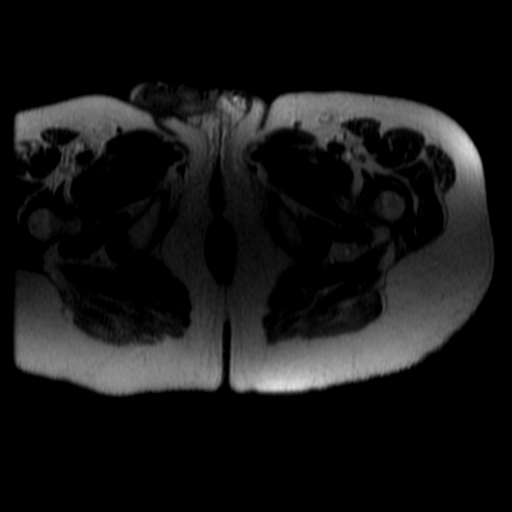
[im 10/15]
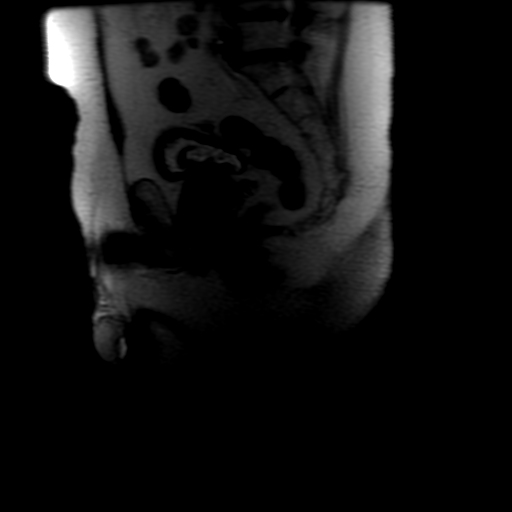
[im 15/15]
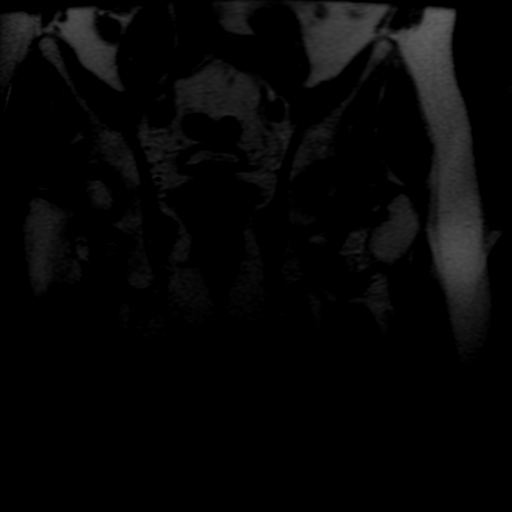

[Series 3: T1 · coronal · 4.0mm · 0.78mm/px · 3 of 28 slices shown]
[im 1/28]
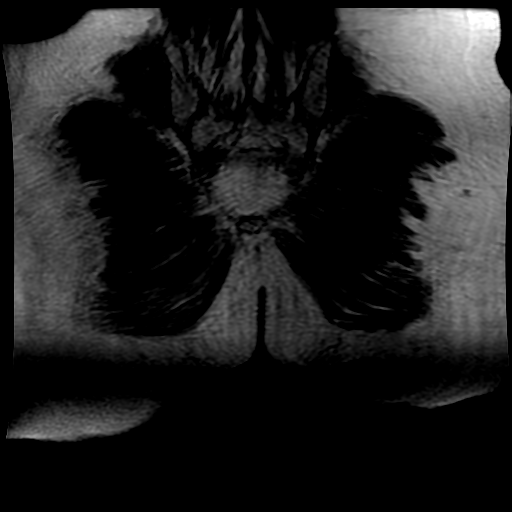
[im 14/28]
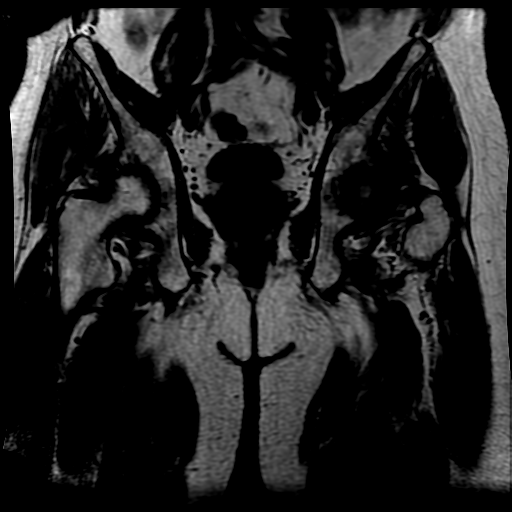
[im 28/28]
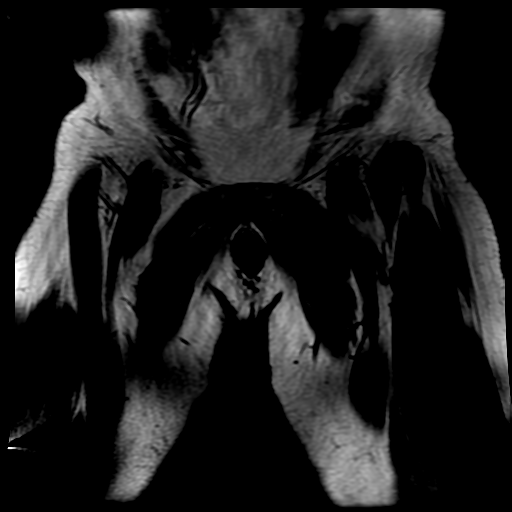

[Series 6: PD · sagittal · 4.0mm · 0.37mm/px · 7 of 34 slices shown (1 of 2)]
[im 1/34]
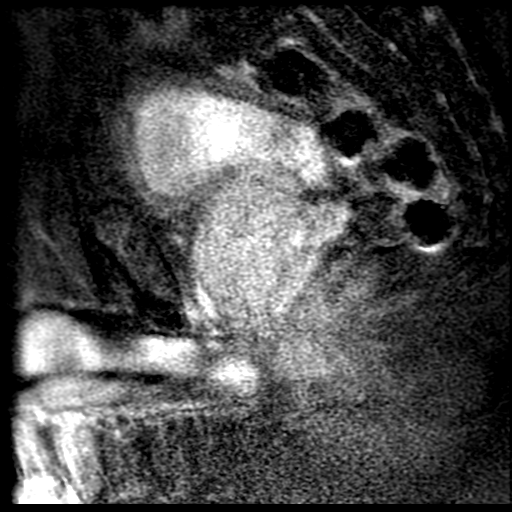
[im 6/34]
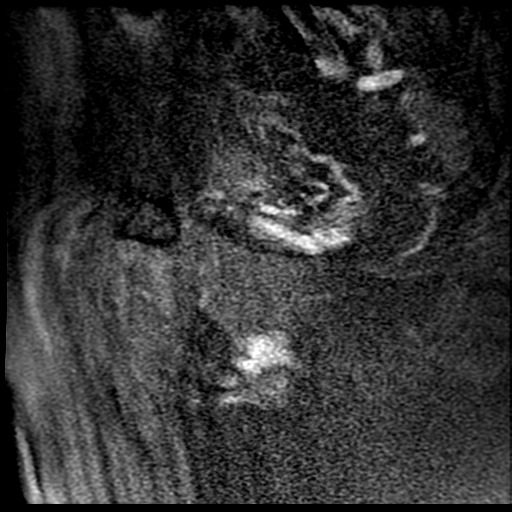
[im 12/34]
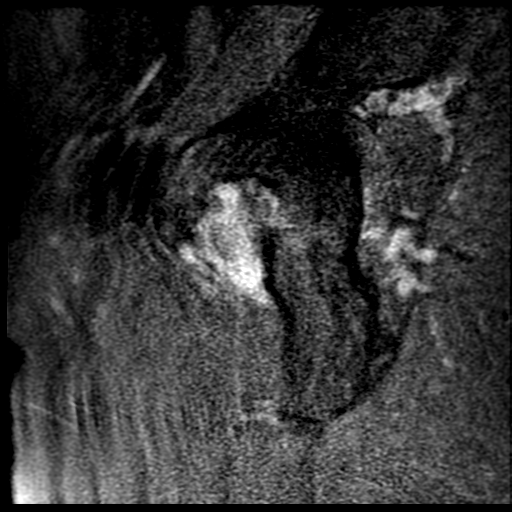
[im 17/34]
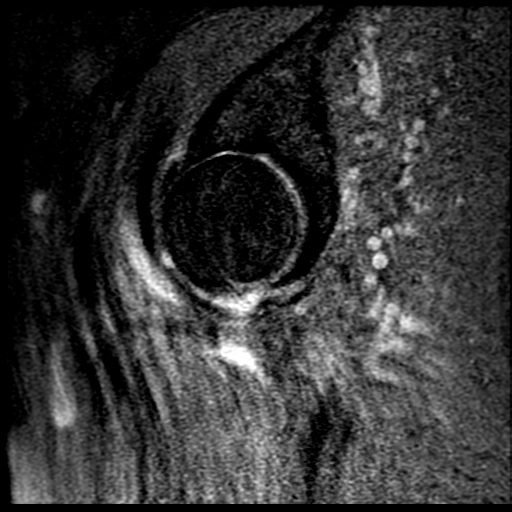
[im 23/34]
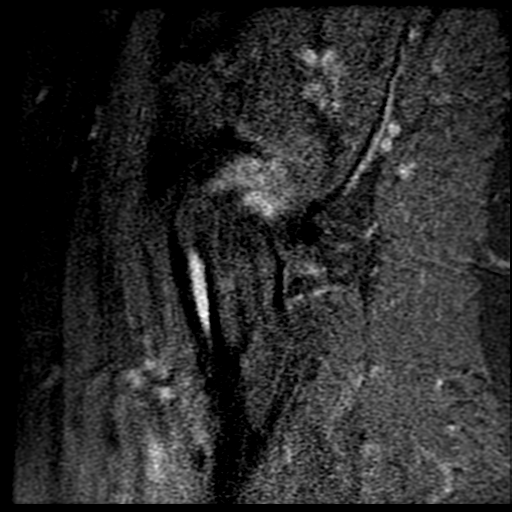
[im 28/34]
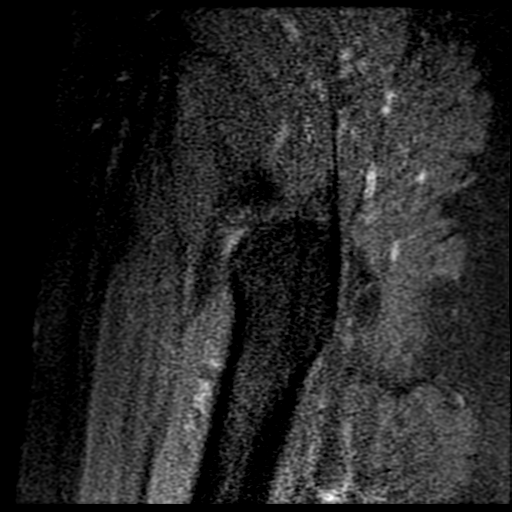
[im 34/34]
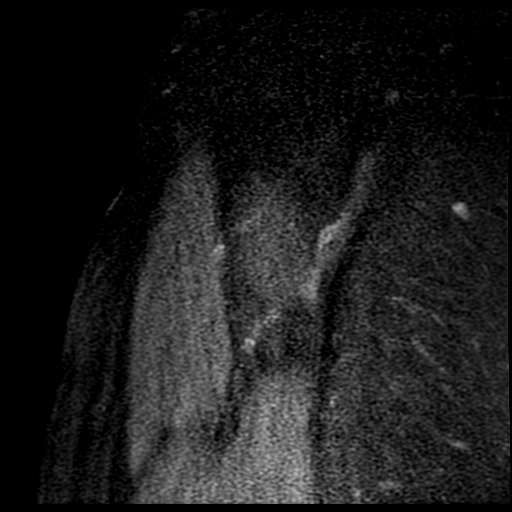

[Series 7: PD · coronal · 4.0mm · 0.37mm/px · 6 of 29 slices shown (2 of 2)]
[im 1/29]
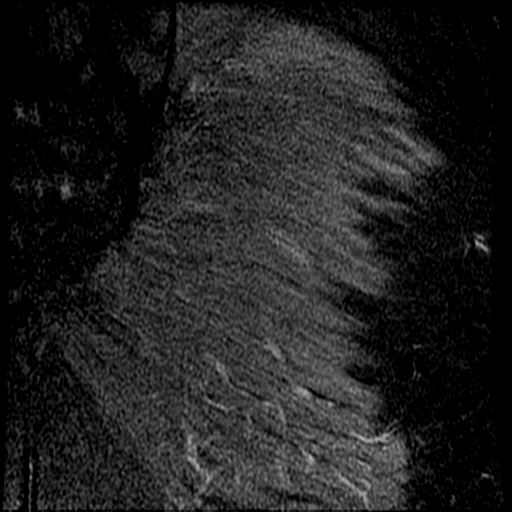
[im 6/29]
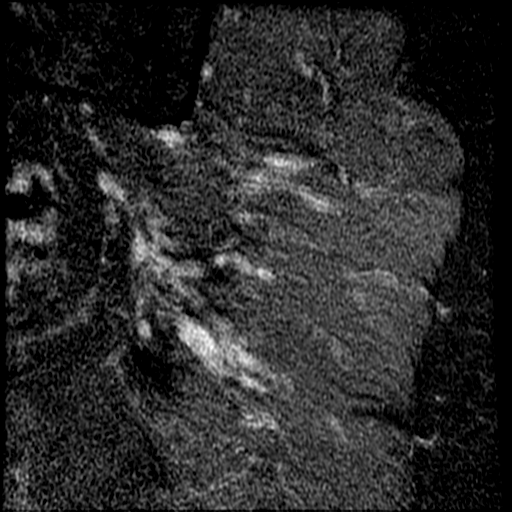
[im 12/29]
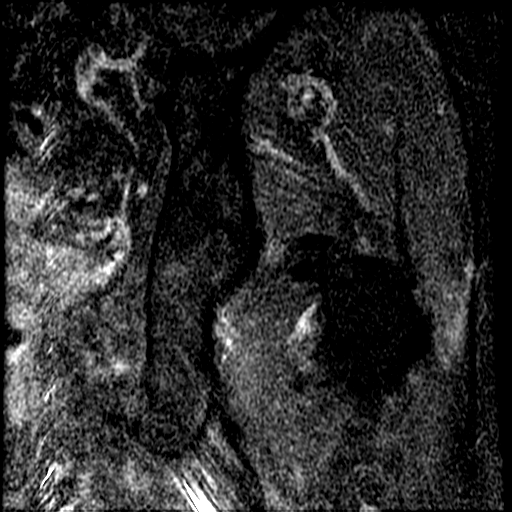
[im 17/29]
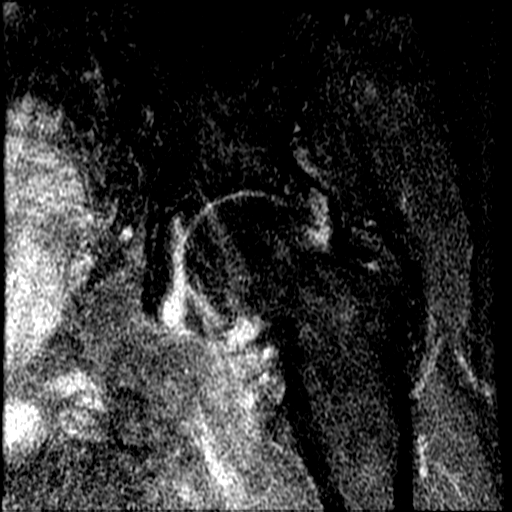
[im 23/29]
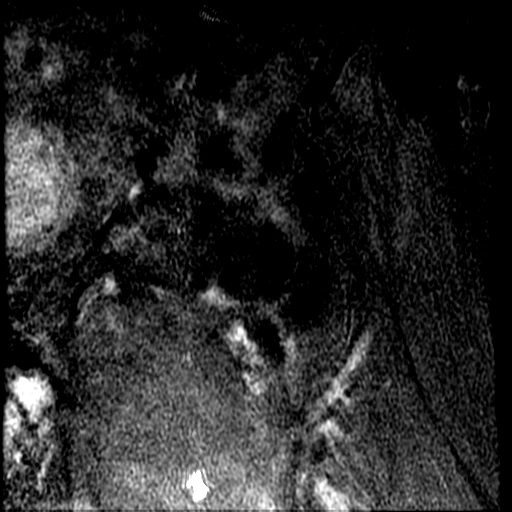
[im 29/29]
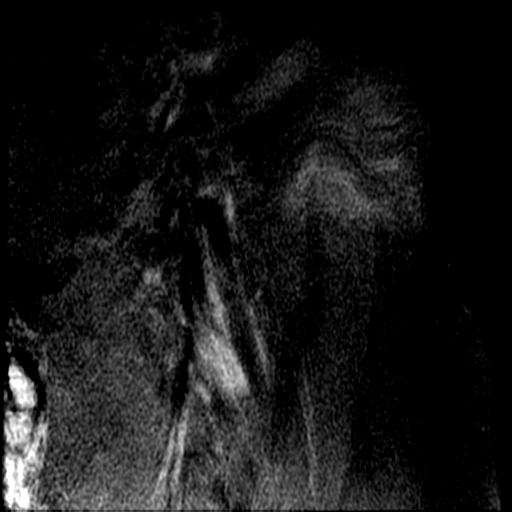

[19 of 40 positions shown; findings below may reference images not displayed]

FINDINGS: Bones: There is no evidence of acute fracture, dislocation or
avascular necrosis. 2.2 cm enchondroma in the right greater
trochanter. The visualized sacroiliac joints and symphysis pubis
appear normal.

Articular cartilage and labrum

Articular cartilage: Scattered partial-thickness cartilage loss in
the left hip joint with areas of full-thickness cartilage loss
superiorly. Subchondral marrow edema in the acetabulum and femoral
head. Mild partial-thickness cartilage loss in the right hip joint.
No subchondral signal abnormality.

Labrum: Degenerative tearing of the left superior labrum.

Joint or bursal effusion

Joint effusion: Trace left hip joint effusion with synovitis.

Bursae: No focal periarticular fluid collection.

Muscles and tendons

Muscles and tendons: The visualized gluteus, hamstring and iliopsoas
tendons appear normal. No muscle edema or atrophy.

Other findings

Miscellaneous: Enlarged prostate with central gland hypertrophy
indenting the bladder base. The visualized internal pelvic contents
otherwise appear unremarkable.
IMPRESSION: 1. Moderate left and mild right hip osteoarthritis.
2. Degenerative tearing of the left superior labrum.
